# Patient Record
Sex: Male | Born: 1975 | Race: Black or African American | Hispanic: No | Marital: Married | Smoking: Current every day smoker
Health system: Southern US, Community
[De-identification: ages and names within clinical notes are randomized; demographics above are authoritative.]

## PROBLEM LIST (undated history)

## (undated) DIAGNOSIS — A048 Other specified bacterial intestinal infections: Secondary | ICD-10-CM

## (undated) DIAGNOSIS — K7460 Unspecified cirrhosis of liver: Secondary | ICD-10-CM

## (undated) HISTORY — PX: APPENDECTOMY: SHX54

## (undated) HISTORY — PX: COLONOSCOPY W/ BIOPSIES AND POLYPECTOMY: SHX1376

---

## 2008-11-29 ENCOUNTER — Emergency Department (HOSPITAL_BASED_OUTPATIENT_CLINIC_OR_DEPARTMENT_OTHER): Admission: EM | Admit: 2008-11-29 | Discharge: 2008-11-29 | Payer: Self-pay | Admitting: Emergency Medicine

## 2008-11-29 ENCOUNTER — Ambulatory Visit: Payer: Self-pay | Admitting: Diagnostic Radiology

## 2012-11-22 ENCOUNTER — Encounter (HOSPITAL_BASED_OUTPATIENT_CLINIC_OR_DEPARTMENT_OTHER): Payer: Self-pay | Admitting: *Deleted

## 2012-11-22 ENCOUNTER — Emergency Department (HOSPITAL_BASED_OUTPATIENT_CLINIC_OR_DEPARTMENT_OTHER)
Admission: EM | Admit: 2012-11-22 | Discharge: 2012-11-22 | Disposition: A | Discharge status: Home / Self Care | Payer: Self-pay | Attending: Emergency Medicine | Admitting: Emergency Medicine

## 2012-11-22 DIAGNOSIS — F17200 Nicotine dependence, unspecified, uncomplicated: Secondary | ICD-10-CM | POA: Insufficient documentation

## 2012-11-22 DIAGNOSIS — H109 Unspecified conjunctivitis: Secondary | ICD-10-CM | POA: Insufficient documentation

## 2012-11-22 MED ORDER — ERYTHROMYCIN 5 MG/GM OP OINT: TOPICAL_OINTMENT | OPHTHALMIC | Status: DC

## 2012-11-22 MED ORDER — FLUORESCEIN SODIUM 1 MG OP STRP
ORAL_STRIP | OPHTHALMIC | Status: AC
  Administered 2012-11-22: 13:00:00
  Filled 2012-11-22: qty 1

## 2012-11-22 MED ORDER — TETRACAINE HCL 0.5 % OP SOLN
1.0000 [drp] | Freq: Once | OPHTHALMIC | Status: AC
  Administered 2012-11-22: 1 [drp] via OPHTHALMIC
  Filled 2012-11-22: qty 2

## 2012-11-22 NOTE — ED Provider Notes (Signed)
History     CSN: [id number]  Arrival date & time 11/22/12  1204   First MD Initiated Contact with Patient 11/22/12 1213      Chief Complaint  Patient presents with  . Eye Problem    (Consider location/radiation/quality/duration/timing/severity/associated sxs/prior treatment) HPI Comments: Patient presents emergency department with chief complaint of right eye pain. Patient states that he first noticed irritation to his right eye yesterday at work. He denies getting anything in it. He denies any mechanism of injury. States that it itches, and that he has had a moderate amount of purulent discharge from the eye. He denies any fever, visual deficits, or double vision. He has not tried anything to alleviate his symptoms. Nothing makes his symptoms better or worse.  The history is provided by the patient. No language interpreter was used.    History reviewed. No pertinent past medical history.  History reviewed. No pertinent past surgical history.  History reviewed. No pertinent family history.  History  Substance Use Topics  . Smoking status: Current Every Day Smoker  . Smokeless tobacco: Not on file  . Alcohol Use: Yes      Review of Systems  All other systems reviewed and are negative.    Allergies  Review of patient's allergies indicates no known allergies.  Home Medications   Current Outpatient Rx  Name  Route  Sig  Dispense  Refill  . erythromycin ophthalmic ointment      Place a 1/2 inch ribbon of ointment into the lower eyelid.   3.5 g   0     BP 128/90  Pulse 90  Temp(Src) 98.3 F (36.8 C) (Oral)  Resp 18  Ht 5\' 9"  (1.753 m)  Wt 165 lb (74.844 kg)  BMI 24.36 kg/m2  SpO2 97%  Physical Exam  Nursing note and vitals reviewed. Constitutional: He is oriented to person, place, and time. He appears well-developed and well-nourished.  HENT:  Head: Normocephalic and atraumatic.  Eyes: EOM are normal. Pupils are equal, round, and reactive to light. Right  eye exhibits discharge. Left eye exhibits no discharge. No scleral icterus.  No foreign bodies seen with slit-lamp and wood's exam, no lacerations or abrasions to the cornea  Neck: Normal range of motion.  Cardiovascular: Normal rate.   Pulmonary/Chest: Effort normal.  Abdominal: He exhibits no distension.  Musculoskeletal: Normal range of motion.  Neurological: He is alert and oriented to person, place, and time.  Skin: Skin is dry.  Psychiatric: He has a normal mood and affect. His behavior is normal. Judgment and thought content normal.    ED Course  Procedures (including critical care time)  Labs Reviewed - No data to display No results found.   1. Conjunctivitis       MDM  Patient with conjunctivitis. Visual acuity is intact. Will treat with Romycin. Followup with ophthalmology for worsening symptoms. Patient is stable and ready for discharge.        Roxy Horseman, PA-C 11/22/12 1254

## 2012-11-22 NOTE — ED Provider Notes (Signed)
History/physical exam/procedure(s) were performed by non-physician practitioner and as supervising physician I was immediately available for consultation/collaboration. I have reviewed all notes and am in agreement with care and plan.   Hilario Quarry, MD 11/22/12 [phone]

## 2012-11-22 NOTE — ED Notes (Addendum)
Patient with right eye irritation and drainage.  States that son had pink eye about two weeks ago.  No problems with vision.

## 2012-11-24 ENCOUNTER — Emergency Department (HOSPITAL_BASED_OUTPATIENT_CLINIC_OR_DEPARTMENT_OTHER)
Admission: EM | Admit: 2012-11-24 | Discharge: 2012-11-24 | Disposition: A | Discharge status: Home / Self Care | Payer: Self-pay | Attending: Emergency Medicine | Admitting: Emergency Medicine

## 2012-11-24 ENCOUNTER — Encounter (HOSPITAL_BASED_OUTPATIENT_CLINIC_OR_DEPARTMENT_OTHER): Payer: Self-pay | Admitting: *Deleted

## 2012-11-24 ENCOUNTER — Emergency Department (HOSPITAL_BASED_OUTPATIENT_CLINIC_OR_DEPARTMENT_OTHER): Payer: Self-pay

## 2012-11-24 DIAGNOSIS — Y9389 Activity, other specified: Secondary | ICD-10-CM | POA: Insufficient documentation

## 2012-11-24 DIAGNOSIS — F17200 Nicotine dependence, unspecified, uncomplicated: Secondary | ICD-10-CM | POA: Insufficient documentation

## 2012-11-24 DIAGNOSIS — Y92410 Unspecified street and highway as the place of occurrence of the external cause: Secondary | ICD-10-CM | POA: Insufficient documentation

## 2012-11-24 DIAGNOSIS — S20219A Contusion of unspecified front wall of thorax, initial encounter: Secondary | ICD-10-CM | POA: Insufficient documentation

## 2012-11-24 DIAGNOSIS — S161XXA Strain of muscle, fascia and tendon at neck level, initial encounter: Secondary | ICD-10-CM

## 2012-11-24 DIAGNOSIS — S20212A Contusion of left front wall of thorax, initial encounter: Secondary | ICD-10-CM

## 2012-11-24 DIAGNOSIS — S139XXA Sprain of joints and ligaments of unspecified parts of neck, initial encounter: Secondary | ICD-10-CM | POA: Insufficient documentation

## 2012-11-24 MED ORDER — IBUPROFEN 800 MG PO TABS: 800.0000 mg | ORAL_TABLET | Freq: Three times a day (TID) | ORAL | Status: DC

## 2012-11-24 MED ORDER — METHOCARBAMOL 500 MG PO TABS: 500.0000 mg | ORAL_TABLET | Freq: Two times a day (BID) | ORAL | Status: DC
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
## 2012-11-24 NOTE — ED Provider Notes (Signed)
Medical screening examination/treatment/procedure(s) were performed by non-physician practitioner and as supervising physician I was immediately available for consultation/collaboration.   Dione Booze, MD 11/24/12 [phone]
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
## 2017-07-03 ENCOUNTER — Encounter (HOSPITAL_BASED_OUTPATIENT_CLINIC_OR_DEPARTMENT_OTHER): Payer: Self-pay | Admitting: Emergency Medicine

## 2017-07-03 ENCOUNTER — Emergency Department (HOSPITAL_BASED_OUTPATIENT_CLINIC_OR_DEPARTMENT_OTHER)
Admission: EM | Admit: 2017-07-03 | Discharge: 2017-07-03 | Disposition: A | Discharge status: Home / Self Care | Payer: Self-pay | Attending: Emergency Medicine | Admitting: Emergency Medicine

## 2017-07-03 ENCOUNTER — Other Ambulatory Visit: Payer: Self-pay

## 2017-07-03 DIAGNOSIS — F17210 Nicotine dependence, cigarettes, uncomplicated: Secondary | ICD-10-CM | POA: Insufficient documentation

## 2017-07-03 DIAGNOSIS — A084 Viral intestinal infection, unspecified: Secondary | ICD-10-CM

## 2017-07-03 HISTORY — DX: Other specified bacterial intestinal infections: A04.8

## 2017-07-03 LAB — CBG MONITORING, ED: GLUCOSE-CAPILLARY: 109 mg/dL — AB (ref 65–99)

## 2017-07-03 MED ORDER — SODIUM CHLORIDE 0.9 % IV BOLUS (SEPSIS)
1000.0000 mL | Freq: Once | INTRAVENOUS | Status: AC
Start: 2017-07-03 — End: 2017-07-03
  Administered 2017-07-03: 1000 mL via INTRAVENOUS

## 2017-07-03 MED ORDER — ONDANSETRON HCL 4 MG/2ML IJ SOLN
4.0000 mg | Freq: Once | INTRAMUSCULAR | Status: AC
  Administered 2017-07-03: 4 mg via INTRAVENOUS
  Filled 2017-07-03: qty 2

## 2017-07-03 MED ORDER — SODIUM CHLORIDE 0.9 % IV BOLUS (SEPSIS)
1000.0000 mL | Freq: Once | INTRAVENOUS | Status: AC
  Administered 2017-07-03: 1000 mL via INTRAVENOUS

## 2017-07-03 MED ORDER — PROMETHAZINE HCL 25 MG/ML IJ SOLN
12.5000 mg | Freq: Once | INTRAMUSCULAR | Status: AC
  Administered 2017-07-03: 12.5 mg via INTRAVENOUS
  Filled 2017-07-03: qty 1

## 2017-07-03 MED ORDER — ONDANSETRON 8 MG PO TBDP: 8.0000 mg | ORAL_TABLET | Freq: Three times a day (TID) | ORAL | 0 refills | Status: DC | PRN

## 2017-07-03 NOTE — ED Provider Notes (Signed)
[facility] Provider Note: Seth DellJ. Lane Selenia Mihok, MD, FACEP  CSN: [id number] MRN: [id number] ARRIVAL: 07/03/17 at 0232 ROOM: [facility]   CHIEF COMPLAINT  Dizziness   HISTORY OF PRESENT ILLNESS  07/03/17 2:44 AM Seth Holmes is a 41 y.o. male with about a 3-hour history of nausea, vomiting and diarrhea.  He has had about 3 episodes each of vomiting and diarrhea.  He feels weak and lightheaded when standing.  He is not aware of having a fever.  He denies abdominal pain or cramping.  He is having blurred vision.   Past Medical History:  Diagnosis Date  . H. pylori infection     Past Surgical History:  Procedure Laterality Date  . APPENDECTOMY    . COLONOSCOPY W/ BIOPSIES AND POLYPECTOMY      No family history on file.  Social History   Tobacco Use  . Smoking status: Current Every Day Smoker    Packs/day: 0.50    Types: Cigarettes  . Smokeless tobacco: Never Used  Substance Use Topics  . Alcohol use: Yes  . Drug use: No    Prior to Admission medications   Medication Sig Start Date End Date Taking? Authorizing Provider  folic acid (FOLVITE) 1 MG tablet Take 1 mg by mouth daily.   Yes [provider]  Multiple Vitamin (MULTIVITAMIN) tablet Take 1 tablet by mouth daily.   Yes [provider]  pantoprazole (PROTONIX) 40 MG tablet Take 40 mg by mouth daily.   Yes [provider]  thiamine 100 MG tablet Take 100 mg by mouth daily.   Yes [provider]  ondansetron (ZOFRAN ODT) 8 MG disintegrating tablet Take 1 tablet (8 mg total) by mouth every 8 (eight) hours as needed for nausea or vomiting. 07/03/17   Samyak Sackmann, Jonny RuizJohn, MD    Allergies Patient has no known allergies.   REVIEW OF SYSTEMS  Negative except as noted here or in the History of Present Illness.   PHYSICAL EXAMINATION  Initial Vital Signs Blood pressure (!) 132/99, pulse 63, temperature 98.2 F (36.8 C), temperature source Oral, resp. rate 18, height 5\' 9"  (1.753  m), weight 77.1 kg (170 lb), SpO2 99 %.  Examination General: Well-developed, well-nourished male in no acute distress; appearance consistent with age of record HENT: normocephalic; atraumatic Eyes: pupils equal, round and reactive to light; extraocular muscles intact Neck: supple Heart: regular rate and rhythm Lungs: clear to auscultation bilaterally Abdomen: soft; nondistended; nontender; no masses or hepatosplenomegaly; bowel sounds present Extremities: No deformity; full range of motion; pulses normal Neurologic: Awake, alert and oriented; motor function intact in all extremities and symmetric; no facial droop Skin: Warm and dry Psychiatric: Flat affect   RESULTS  Summary of this visit's results, reviewed by myself:   EKG Interpretation  Date/Time:    Ventricular Rate:    PR Interval:    QRS Duration:   QT Interval:    QTC Calculation:   R Axis:     Text Interpretation:        Laboratory Studies: Results for orders placed or performed during the hospital encounter of 07/03/17 (from the past 24 hour(s))  CBG monitoring, ED     Status: Abnormal   Collection Time: 07/03/17  2:49 AM  Result Value Ref Range   Glucose-Capillary 109 (H) 65 - 99 mg/dL   Imaging Studies: No results found.  ED COURSE  Nursing notes and initial vitals signs, including pulse oximetry, reviewed.  Vitals:   07/03/17 0238  BP: Marland Kitchen(!)  132/99  Pulse: 63  Resp: 18  Temp: 98.2 F (36.8 C)  TempSrc: Oral  SpO2: 99%  Weight: 77.1 kg (170 lb)  Height: 5\' 9"  (1.753 m)   5:23 AM Patient feeling better after IV fluid bolus and IV antiemetics.  Drinking fluids without emesis.  PROCEDURES    ED DIAGNOSES     ICD-10-CM   1. Viral gastroenteritis A08.4        Lizmarie Witters, MD 07/03/17 [phone]

## 2017-07-03 NOTE — ED Notes (Signed)
Pt has not vomited since drinking Gatorade. Pt still c/o dizziness. Pt ambulatory to bathroom to void for second time. Pt given crackers per request.

## 2017-07-03 NOTE — ED Triage Notes (Signed)
Pt c/o dizziness that started a few hours ago and has since had 2 episodes of vomiting and 3 episodes of diarrhea.

## 2017-09-30 ENCOUNTER — Emergency Department (HOSPITAL_BASED_OUTPATIENT_CLINIC_OR_DEPARTMENT_OTHER)
Admission: EM | Admit: 2017-09-30 | Discharge: 2017-09-30 | Disposition: A | Discharge status: Home / Self Care | Payer: Self-pay | Attending: Emergency Medicine | Admitting: Emergency Medicine

## 2017-09-30 ENCOUNTER — Encounter (HOSPITAL_BASED_OUTPATIENT_CLINIC_OR_DEPARTMENT_OTHER): Payer: Self-pay | Admitting: Emergency Medicine

## 2017-09-30 ENCOUNTER — Other Ambulatory Visit: Payer: Self-pay

## 2017-09-30 ENCOUNTER — Emergency Department (HOSPITAL_BASED_OUTPATIENT_CLINIC_OR_DEPARTMENT_OTHER): Payer: Self-pay

## 2017-09-30 DIAGNOSIS — M79642 Pain in left hand: Secondary | ICD-10-CM | POA: Insufficient documentation

## 2017-09-30 DIAGNOSIS — M7989 Other specified soft tissue disorders: Secondary | ICD-10-CM

## 2017-09-30 DIAGNOSIS — R2232 Localized swelling, mass and lump, left upper limb: Secondary | ICD-10-CM | POA: Insufficient documentation

## 2017-09-30 DIAGNOSIS — F17210 Nicotine dependence, cigarettes, uncomplicated: Secondary | ICD-10-CM | POA: Insufficient documentation

## 2017-09-30 MED ORDER — IBUPROFEN 800 MG PO TABS
800.0000 mg | ORAL_TABLET | Freq: Once | ORAL | Status: AC
  Administered 2017-09-30: 800 mg via ORAL
  Filled 2017-09-30: qty 1

## 2017-09-30 MED ORDER — IBUPROFEN 800 MG PO TABS: 800.0000 mg | ORAL_TABLET | Freq: Three times a day (TID) | ORAL | 0 refills | Status: DC

## 2017-09-30 NOTE — ED Provider Notes (Addendum)
[facility] EMERGENCY DEPARTMENT Provider Note   CSN: [id number] Arrival date & time: 09/30/17  0841     History   Chief Complaint Chief Complaint  Patient presents with  . Hand Injury    HPI Seth Holmes is a 42 y.o. male.  42 year old right-handed male who presents with left hand pain and swelling.  He reports 1 month of intermittent swelling and pain on his dorsal left hand.  He denies any specific injury to his hand but states that it seems to get worse when he is using his hand lifting at work.  He notices that the swelling goes down when he sleeps but this morning the swelling had continued.  Pain is worse with movement.  No medications prior to arrival.  No IV drug use.   The history is provided by the patient.  Hand Injury   Pertinent negatives include no fever.    Past Medical History:  Diagnosis Date  . H. pylori infection     There are no active problems to display for this patient.   Past Surgical History:  Procedure Laterality Date  . APPENDECTOMY    . COLONOSCOPY W/ BIOPSIES AND POLYPECTOMY         Home Medications    Prior to Admission medications   Not on File    Family History No family history on file.  Social History Social History   Tobacco Use  . Smoking status: Current Every Day Smoker    Packs/day: 0.50    Types: Cigarettes  . Smokeless tobacco: Never Used  Substance Use Topics  . Alcohol use: Yes  . Drug use: No     Allergies   Patient has no known allergies.   Review of Systems Review of Systems  Constitutional: Negative for fever.  Musculoskeletal: Positive for joint swelling.  Skin: Negative for rash and wound.  Neurological: Negative for weakness and numbness.     Physical Exam Updated Vital Signs BP (!) 141/86   Pulse 87   Temp 98.1 F (36.7 C) (Oral)   Resp 18   Ht 5\' 9"  (1.753 m)   Wt 79.4 kg (175 lb)   SpO2 99%   BMI 25.84 kg/m   Physical Exam  Constitutional: He is oriented to  person, place, and time. He appears well-developed and well-nourished. No distress.  HENT:  Head: Normocephalic and atraumatic.  Eyes: Conjunctivae are normal.  Neck: Neck supple.  Musculoskeletal: He exhibits edema and tenderness.  Mild edema dorsal L hand predominantly over 3rd MCP joint with tenderness; full ROM and strength of fingers and thumb, no wrist swelling or pain, no palmar involvement  Neurological: He is alert and oriented to person, place, and time. No sensory deficit.  Skin: Skin is warm and dry. No rash noted. No erythema.  Psychiatric: He has a normal mood and affect. Judgment normal.  Nursing note and vitals reviewed.    ED Treatments / Results  Labs (all labs ordered are listed, but only abnormal results are displayed) Labs Reviewed - No data to display  EKG  EKG Interpretation None       Radiology Dg Hand Complete Left  Result Date: 09/30/2017 CLINICAL DATA:  Swelling of the hand with some pain over the last month, no known injury EXAM: LEFT HAND - COMPLETE 3+ VIEW COMPARISON:  None. FINDINGS: The left radiocarpal joint space appears normal and the carpal bones are in normal position. The ulnar styloid is intact. MCP, PIP, DIP joints appear normal. No  erosion is seen. No fracture is noted. IMPRESSION: Negative. Electronically Signed   By: Dwyane Dee M.D.   On: 09/30/2017 09:25    Procedures .Splint Application Date/Time: 09/30/2017 9:51 AM Performed by: Laurence Spates, MD Authorized by: Laurence Spates, MD   Consent:    Consent obtained:  Verbal   Consent given by:  Patient Pre-procedure details:    Sensation:  Normal Procedure details:    Laterality:  Left   Location:  Hand   Splint type:  Wrist   Supplies:  Prefabricated splint Post-procedure details:    Pain:  Unchanged   Sensation:  Normal   Patient tolerance of procedure:  Tolerated well, no immediate complications   (including critical care time)  Medications Ordered in  ED Medications - No data to display   Initial Impression / Assessment and Plan / ED Course  I have reviewed the triage vital signs and the nursing notes.  Pertinent  imaging results that were available during my care of the patient were reviewed by me and considered in my medical decision making (see chart for details).    XR negative. No evidence of infection on exam and given 1 mo h/o waxing and waning sx infection seems very unlikely. Recommended NSAIDs, elevation and immobilization with brace. Provided sports med f/u info as needed if persistent sx. Return precautions reviewed.  Final Clinical Impressions(s) / ED Diagnoses   Final diagnoses:  None    ED Discharge Orders    None       Little, Ambrose Finland, MD 09/30/17 6962    Clarene Duke Ambrose Finland, MD 09/30/17 [phone]

## 2017-09-30 NOTE — ED Triage Notes (Addendum)
Pt states his left hand has been hurting and swelling for over a month.   Usually the swelling goes down after sleeping all night but this am the swelling continues.  Noted obvious swelling to left hand, painful with movement, radial pulse 2+.  No redness or cyanosis noted.  Pt states he doesn't recall any injuries but does heavy lifting at work.

## 2017-11-23 ENCOUNTER — Emergency Department (HOSPITAL_BASED_OUTPATIENT_CLINIC_OR_DEPARTMENT_OTHER)
Admission: EM | Admit: 2017-11-23 | Discharge: 2017-11-24 | Disposition: A | Discharge status: Home / Self Care | Payer: Managed Care, Other (non HMO) | Attending: Emergency Medicine | Admitting: Emergency Medicine

## 2017-11-23 ENCOUNTER — Encounter (HOSPITAL_BASED_OUTPATIENT_CLINIC_OR_DEPARTMENT_OTHER): Payer: Self-pay | Admitting: Emergency Medicine

## 2017-11-23 ENCOUNTER — Emergency Department (HOSPITAL_BASED_OUTPATIENT_CLINIC_OR_DEPARTMENT_OTHER): Payer: Managed Care, Other (non HMO)

## 2017-11-23 ENCOUNTER — Other Ambulatory Visit: Payer: Self-pay

## 2017-11-23 DIAGNOSIS — M79642 Pain in left hand: Secondary | ICD-10-CM | POA: Diagnosis present

## 2017-11-23 DIAGNOSIS — F17210 Nicotine dependence, cigarettes, uncomplicated: Secondary | ICD-10-CM | POA: Insufficient documentation

## 2017-11-23 MED ORDER — HYDROCODONE-ACETAMINOPHEN 5-325 MG PO TABS: 1.0000 | ORAL_TABLET | Freq: Four times a day (QID) | ORAL | 0 refills | Status: DC | PRN

## 2017-11-23 MED ORDER — KETOROLAC TROMETHAMINE 60 MG/2ML IM SOLN
60.0000 mg | Freq: Once | INTRAMUSCULAR | Status: AC
Start: 2017-11-23 — End: 2017-11-23
  Administered 2017-11-23: 60 mg via INTRAMUSCULAR
  Filled 2017-11-23: qty 2

## 2017-11-23 MED ORDER — HYDROCODONE-ACETAMINOPHEN 5-325 MG PO TABS: 1.0000 | ORAL_TABLET | Freq: Once | ORAL | Status: DC

## 2017-11-23 MED ORDER — IBUPROFEN 800 MG PO TABS: 800.0000 mg | ORAL_TABLET | Freq: Three times a day (TID) | ORAL | 0 refills | Status: DC

## 2017-11-23 NOTE — ED Notes (Signed)
Pt refuses XR at this time.

## 2017-11-23 NOTE — ED Triage Notes (Signed)
L hand pain and swelling for several days. Unknown if he injured it. States it hurts worse during work.

## 2017-11-23 NOTE — ED Notes (Signed)
Pt given d/c instructions as per chart. Verbalizes understanding. No questions. Rx x 2 with precautions. 

## 2017-11-24 NOTE — ED Provider Notes (Signed)
[facility] EMERGENCY DEPARTMENT Provider Note   CSN: [id number] Arrival date & time: 11/23/17  1828     History   Chief Complaint Chief Complaint  Patient presents with  . Hand Pain    HPI Seth Holmes is a 42 y.o. male.   Hand Pain  This is a recurrent problem. The current episode started more than 1 week ago. The problem occurs constantly. The problem has not changed since onset.Pertinent negatives include no chest pain, no abdominal pain and no headaches. The symptoms are aggravated by bending and twisting. Nothing relieves the symptoms. He has tried nothing for the symptoms.    Past Medical History:  Diagnosis Date  . H. pylori infection     There are no active problems to display for this patient.   Past Surgical History:  Procedure Laterality Date  . APPENDECTOMY    . COLONOSCOPY W/ BIOPSIES AND POLYPECTOMY          Home Medications    Prior to Admission medications   Medication Sig Start Date End Date Taking? Authorizing Provider  HYDROcodone-acetaminophen (NORCO/VICODIN) 5-325 MG tablet Take 1 tablet by mouth every 6 (six) hours as needed for severe pain. 11/23/17   Jacy Howat, Barbara Cower, MD  ibuprofen (ADVIL,MOTRIN) 800 MG tablet Take 1 tablet (800 mg total) by mouth 3 (three) times daily. 11/23/17   Katheen Aslin, Barbara Cower, MD    Family History No family history on file.  Social History Social History   Tobacco Use  . Smoking status: Current Every Day Smoker    Packs/day: 0.50    Types: Cigarettes  . Smokeless tobacco: Never Used  Substance Use Topics  . Alcohol use: Yes  . Drug use: No     Allergies   Patient has no known allergies.   Review of Systems Review of Systems  Cardiovascular: Negative for chest pain.  Gastrointestinal: Negative for abdominal pain.  Neurological: Negative for headaches.  All other systems reviewed and are negative.    Physical Exam Updated Vital Signs BP (!) 145/93 (BP Location: Right Arm)   Pulse 78   Temp  98.2 F (36.8 C) (Oral)   Resp 18   Ht  (1.753 m)   Wt 79.4 kg (175 lb)   SpO2 98%   BMI 25.84 kg/m   Physical Exam  Constitutional: He appears well-developed and well-nourished.  HENT:  Head: Normocephalic and atraumatic.  Eyes: Conjunctivae and EOM are normal.  Neck: Normal range of motion.  Cardiovascular: Normal rate.  Pulmonary/Chest: Effort normal. No respiratory distress.  Abdominal: Soft. Bowel sounds are normal. He exhibits no distension.  Musculoskeletal: Normal range of motion. He exhibits edema and tenderness (left dorsal hand. no erythema, warmth, induration or fluctuance).  Neurological: He is alert.  Skin: Skin is warm and dry.  Nursing note and vitals reviewed.    ED Treatments / Results  Labs (all labs ordered are listed, but only abnormal results are displayed) Labs Reviewed - No data to display  EKG None  Radiology Dg Hand Complete Left  Result Date: 11/23/2017 CLINICAL DATA:  Acute onset left hand pain for 1 week. EXAM: LEFT HAND - COMPLETE 3+ VIEW COMPARISON:  09/30/2017 FINDINGS: There is no evidence of fracture or dislocation. Normal bone mineralization. No marginal nor extra articular erosions. No evidence of periostitis. Stable small ossicle adjacent to the base of the fifth proximal phalanx. There is no evidence of arthropathy or other focal bone abnormality. Carpal rows are maintained. The distal radius and ulna are  unremarkable. Soft tissues are unremarkable. IMPRESSION: No radiographic findings for the patient's hand pain. Electronically Signed   By: Tollie Eth M.D.   On: 11/23/2017 21:59    Procedures Procedures (including critical care time)  Medications Ordered in ED Medications  ketorolac (TORADOL) injection 60 mg (60 mg Intramuscular Given 11/23/17 2155)     Initial Impression / Assessment and Plan / ED Course  I have reviewed the triage vital signs and the nursing notes.  Pertinent labs & imaging results that were available  during my care of the patient were reviewed by me and considered in my medical decision making (see chart for details).     Unclear etiology. Suggest ace wrap/nsaids and ortho follow up. Low suspicion for infection/cyst/abscess or fracture at this time.   Final Clinical Impressions(s) / ED Diagnoses   Final diagnoses:  Left hand pain    ED Discharge Orders        Ordered    HYDROcodone-acetaminophen (NORCO/VICODIN) 5-325 MG tablet  Every 6 hours PRN     11/23/17 2331    ibuprofen (ADVIL,MOTRIN) 800 MG tablet  3 times daily     11/23/17 2331       Giovanni Bath, Barbara Cower, MD 11/24/17 1640

## 2019-09-04 ENCOUNTER — Encounter (HOSPITAL_BASED_OUTPATIENT_CLINIC_OR_DEPARTMENT_OTHER): Payer: Self-pay | Admitting: Emergency Medicine

## 2019-09-04 ENCOUNTER — Emergency Department (HOSPITAL_BASED_OUTPATIENT_CLINIC_OR_DEPARTMENT_OTHER): Payer: 59

## 2019-09-04 ENCOUNTER — Other Ambulatory Visit: Payer: Self-pay

## 2019-09-04 ENCOUNTER — Emergency Department (HOSPITAL_BASED_OUTPATIENT_CLINIC_OR_DEPARTMENT_OTHER)
Admission: EM | Admit: 2019-09-04 | Discharge: 2019-09-04 | Disposition: A | Discharge status: Home / Self Care | Payer: 59 | Attending: Emergency Medicine | Admitting: Emergency Medicine

## 2019-09-04 DIAGNOSIS — R945 Abnormal results of liver function studies: Secondary | ICD-10-CM | POA: Diagnosis not present

## 2019-09-04 DIAGNOSIS — M79604 Pain in right leg: Secondary | ICD-10-CM | POA: Diagnosis present

## 2019-09-04 DIAGNOSIS — R7989 Other specified abnormal findings of blood chemistry: Secondary | ICD-10-CM

## 2019-09-04 DIAGNOSIS — R609 Edema, unspecified: Secondary | ICD-10-CM | POA: Insufficient documentation

## 2019-09-04 DIAGNOSIS — Z79899 Other long term (current) drug therapy: Secondary | ICD-10-CM | POA: Insufficient documentation

## 2019-09-04 DIAGNOSIS — D539 Nutritional anemia, unspecified: Secondary | ICD-10-CM | POA: Insufficient documentation

## 2019-09-04 DIAGNOSIS — F17210 Nicotine dependence, cigarettes, uncomplicated: Secondary | ICD-10-CM | POA: Diagnosis not present

## 2019-09-04 LAB — CBC WITH DIFFERENTIAL/PLATELET
Abs Immature Granulocytes: 0.02 10*3/uL (ref 0.00–0.07)
Basophils Absolute: 0.1 10*3/uL (ref 0.0–0.1)
Basophils Relative: 1 %
Eosinophils Absolute: 0.2 10*3/uL (ref 0.0–0.5)
Eosinophils Relative: 3 %
HCT: 27.2 % — ABNORMAL LOW (ref 39.0–52.0)
Hemoglobin: 8.8 g/dL — ABNORMAL LOW (ref 13.0–17.0)
Immature Granulocytes: 0 %
Lymphocytes Relative: 34 %
Lymphs Abs: 2.7 10*3/uL (ref 0.7–4.0)
MCH: 35.1 pg — ABNORMAL HIGH (ref 26.0–34.0)
MCHC: 32.4 g/dL (ref 30.0–36.0)
MCV: 108.4 fL — ABNORMAL HIGH (ref 80.0–100.0)
Monocytes Absolute: 0.7 10*3/uL (ref 0.1–1.0)
Monocytes Relative: 9 %
Neutro Abs: 4.2 10*3/uL (ref 1.7–7.7)
Neutrophils Relative %: 53 %
Platelets: 212 10*3/uL (ref 150–400)
RBC: 2.51 MIL/uL — ABNORMAL LOW (ref 4.22–5.81)
RDW: 14.4 % (ref 11.5–15.5)
WBC: 7.9 10*3/uL (ref 4.0–10.5)
nRBC: 0 % (ref 0.0–0.2)

## 2019-09-04 LAB — COMPREHENSIVE METABOLIC PANEL
ALT: 26 U/L (ref 0–44)
AST: 103 U/L — ABNORMAL HIGH (ref 15–41)
Albumin: 2.5 g/dL — ABNORMAL LOW (ref 3.5–5.0)
Alkaline Phosphatase: 133 U/L — ABNORMAL HIGH (ref 38–126)
Anion gap: 9 (ref 5–15)
BUN: 3 mg/dL — ABNORMAL LOW (ref 6–20)
CO2: 20 mmol/L — ABNORMAL LOW (ref 22–32)
Calcium: 8.7 mg/dL — ABNORMAL LOW (ref 8.9–10.3)
Chloride: 107 mmol/L (ref 98–111)
Creatinine, Ser: 0.61 mg/dL (ref 0.61–1.24)
GFR calc Af Amer: >60 mL/min (ref >60)
GFR calc non Af Amer: >60 mL/min (ref >60)
Glucose, Bld: 96 mg/dL (ref 70–99)
Potassium: 3.6 mmol/L (ref 3.5–5.1)
Sodium: 136 mmol/L (ref 135–145)
Total Bilirubin: 3.3 mg/dL — ABNORMAL HIGH (ref 0.3–1.2)
Total Protein: 9.3 g/dL — ABNORMAL HIGH (ref 6.5–8.1)

## 2019-09-04 MED ORDER — FOLIC ACID 400 MCG PO TABS: 400.0000 ug | ORAL_TABLET | Freq: Every day | ORAL | 0 refills | Status: DC

## 2019-09-04 MED ORDER — VITAMIN B-12 100 MCG PO TABS: 100.0000 ug | ORAL_TABLET | Freq: Every day | ORAL | 0 refills | Status: DC

## 2019-09-04 MED ORDER — FUROSEMIDE 20 MG PO TABS: 20.0000 mg | ORAL_TABLET | Freq: Two times a day (BID) | ORAL | 0 refills | Status: DC

## 2019-09-04 MED ORDER — POTASSIUM CHLORIDE CRYS ER 20 MEQ PO TBCR: 20.0000 meq | EXTENDED_RELEASE_TABLET | Freq: Every day | ORAL | 0 refills | Status: DC

## 2019-09-04 NOTE — ED Provider Notes (Addendum)
[facility] EMERGENCY DEPARTMENT Provider Note   CSN: [id number] Arrival date & time: 09/04/19  1128     History Chief Complaint  Patient presents with  . Foot Swelling    Seth Holmes is a 44 y.o. male.  HPI   44 year old male with lower extremity edema.  Onset several weeks ago.  Progressively worsening.  First noticed in right leg and has since progressed to his left leg as well.  Denies any pain.  Review went to urgent care for evaluation was placed on antibiotics for possible infection.  Perhaps some mild improvement initially before worsening again.  Denies any rash.  No fevers or chills.  No respiratory complaints.  No orthopnea.  No abdominal pain.  He drinks alcohol daily and has done so for years. He was hesitant to quantify how much. I threw out numbers (1, 6-pack, a case, etc) and he replied : "Yeah about a 6-pack."   Past Medical History:  Diagnosis Date  . H. pylori infection    There are no problems to display for this patient.  Past Surgical History:  Procedure Laterality Date  . APPENDECTOMY    . COLONOSCOPY W/ BIOPSIES AND POLYPECTOMY       History reviewed. No pertinent family history.  Social History   Tobacco Use  . Smoking status: Current Every Day Smoker    Packs/day: 0.50    Types: Cigarettes  . Smokeless tobacco: Never Used  Substance Use Topics  . Alcohol use: Yes  . Drug use: No   Home Medications Prior to Admission medications   Medication Sig Start Date End Date Taking? Authorizing Provider  folic acid (V-R FOLIC ACID) 400 MCG tablet Take 1 tablet (400 mcg total) by mouth daily. 09/04/19   Raeford Razor, MD  furosemide (LASIX) 20 MG tablet Take 1 tablet (20 mg total) by mouth 2 (two) times daily. 09/04/19   Raeford Razor, MD  HYDROcodone-acetaminophen (NORCO/VICODIN) 5-325 MG tablet Take 1 tablet by mouth every 6 (six) hours as needed for severe pain. 11/23/17   Mesner, Barbara Cower, MD  ibuprofen (ADVIL,MOTRIN) 800 MG tablet Take 1  tablet (800 mg total) by mouth 3 (three) times daily. 11/23/17   Mesner, Barbara Cower, MD  potassium chloride SA (KLOR-CON) 20 MEQ tablet Take 1 tablet (20 mEq total) by mouth daily. 09/04/19   Raeford Razor, MD  vitamin B-12 (CYANOCOBALAMIN) 100 MCG tablet Take 1 tablet (100 mcg total) by mouth daily. 09/04/19   Raeford Razor, MD   Allergies    Patient has no known allergies.  Review of Systems   Review of Systems All systems reviewed and negative, other than as noted in HPI.  Physical Exam Updated Vital Signs BP 135/90 (BP Location: Right Arm)   Pulse 93   Temp 98.2 F (36.8 C) (Oral)   Resp 16   Ht 5\' 10"  (1.778 m)   Wt 79.4 kg   SpO2 100%   BMI 25.11 kg/m   Physical Exam Vitals and nursing note reviewed.  Constitutional:      General: He is not in acute distress.    Appearance: He is well-developed.  HENT:     Head: Normocephalic and atraumatic.  Eyes:     General:        Right eye: No discharge.        Left eye: No discharge.     Conjunctiva/sclera: Conjunctivae normal.  Cardiovascular:     Rate and Rhythm: Normal rate and regular rhythm.     Heart  sounds: Normal heart sounds. No murmur. No friction rub. No gallop.   Pulmonary:     Effort: Pulmonary effort is normal. No respiratory distress.     Breath sounds: Normal breath sounds.  Abdominal:     General: There is no distension.     Palpations: Abdomen is soft.     Tenderness: There is no abdominal tenderness.  Musculoskeletal:        General: No tenderness.     Cervical back: Neck supple.     Comments: Somewhat pitting edema to bilateral lower extremities, right greater than left.  No calf tenderness.  No concerning skin changes otherwise.  Good DP pulses bilaterally.  Skin:    General: Skin is warm and dry.  Neurological:     Mental Status: He is alert.  Psychiatric:        Behavior: Behavior normal.        Thought Content: Thought content normal.     ED Results / Procedures / Treatments   Labs (all labs  ordered are listed, but only abnormal results are displayed) Labs Reviewed  CBC WITH DIFFERENTIAL/PLATELET - Abnormal; Notable for the following components:      Result Value   RBC 2.51 (*)    Hemoglobin 8.8 (*)    HCT 27.2 (*)    MCV 108.4 (*)    MCH 35.1 (*)    All other components within normal limits  COMPREHENSIVE METABOLIC PANEL - Abnormal; Notable for the following components:   CO2 20 (*)    BUN 3 (*)    Calcium 8.7 (*)    Total Protein 9.3 (*)    Albumin 2.5 (*)    AST 103 (*)    Alkaline Phosphatase 133 (*)    Total Bilirubin 3.3 (*)    All other components within normal limits    EKG None  Radiology US Venous Img Lower Bilateral  Result Date: 09/04/2019 CLINICAL DATA:  Bilateral lower extremity swelling for 1 month EXAM: BILATERAL LOWER EXTREMITY VENOUS DOPPLER ULTRASOUND TECHNIQUE: Gray-scale sonography with graded compression, as well as color Doppler and duplex ultrasound were performed to evaluate the lower extremity deep venous systems from the level of the common femoral vein and including the common femoral, femoral, profunda femoral, popliteal and calf veins including the posterior tibial, peroneal and gastrocnemius veins when visible. The superficial great saphenous vein was also interrogated. Spectral Doppler was utilized to evaluate flow at rest and with distal augmentation maneuvers in the common femoral, femoral and popliteal veins. COMPARISON:  None. FINDINGS: RIGHT LOWER EXTREMITY Common Femoral Vein: No evidence of thrombus. Normal compressibility, respiratory phasicity and response to augmentation. Saphenofemoral Junction: No evidence of thrombus. Normal compressibility and flow on color Doppler imaging. Profunda Femoral Vein: No evidence of thrombus. Normal compressibility and flow on color Doppler imaging. Femoral Vein: No evidence of thrombus. Normal compressibility, respiratory phasicity and response to augmentation. Popliteal Vein: No evidence of thrombus.  Normal compressibility, respiratory phasicity and response to augmentation. Calf Veins: No evidence of thrombus. Normal compressibility and flow on color Doppler imaging. Superficial Great Saphenous Vein: No evidence of thrombus. Normal compressibility. Venous Reflux:  None. Other Findings:  None. LEFT LOWER EXTREMITY Common Femoral Vein: No evidence of thrombus. Normal compressibility, respiratory phasicity and response to augmentation. Saphenofemoral Junction: No evidence of thrombus. Normal compressibility and flow on color Doppler imaging. Profunda Femoral Vein: No evidence of thrombus. Normal compressibility and flow on color Doppler imaging. Femoral Vein: No evidence of thrombus. Normal compressibility, respiratory phasicity  and response to augmentation. Popliteal Vein: No evidence of thrombus. Normal compressibility, respiratory phasicity and response to augmentation. Calf Veins: No evidence of thrombus. Normal compressibility and flow on color Doppler imaging. Superficial Great Saphenous Vein: No evidence of thrombus. Normal compressibility. Venous Reflux:  None. Other Findings:  None. IMPRESSION: No evidence of deep venous thrombosis in either lower extremity. Electronically Signed   By: Alcide Clever M.D.   On: 09/04/2019 13:27    Procedures Procedures (including critical care time)  Medications Ordered in ED Medications - No data to display  ED Course  I have reviewed the triage vital signs and the nursing notes.  Pertinent labs & imaging results that were available during my care of the patient were reviewed by me and considered in my medical decision making (see chart for details).    MDM Rules/Calculators/A&P                      44 year old male with asymmetric edema in lower extremities.  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  Patient drinks quite a bit of alcohol daily.  He has been doing so for years.  Ultrasound was negative for DVT.  LFTs are abnormal and  hypoalbuminemia which is consistent with history of alcohol abuse.  He also has a macrocytic anemia.  Denies any overt bleeding, melena or symptoms to suggest that he is symptomatic from this anemia.  We will put him on B12 and folic acid for possible poor absorption with etoh use. Could also be from marrow suppression related to etoh. Platelets are normal. Consider GIB but doubt significant one. I explained to him under no certain terms that he really needs to stop alcohol.  He was provided with a list of resources.  He will be prescribed few days of Lasix/potassium.  I advised him that he needs to obtain a PCP that can follow him regularly.  Final Clinical Impression(s) / ED Diagnoses Final diagnoses:  Peripheral edema  Macrocytic anemia  Abnormal LFTs    Rx / DC Orders ED Discharge Orders         Ordered    furosemide (LASIX) 20 MG tablet  2 times daily     09/04/19 1338    potassium chloride SA (KLOR-CON) 20 MEQ tablet  Daily     09/04/19 1338    vitamin B-12 (CYANOCOBALAMIN) 100 MCG tablet  Daily     09/04/19 1338    folic acid (V-R FOLIC ACID) 400 MCG tablet  Daily     09/04/19 1338               Raeford Razor, MD 09/04/19 1406

## 2019-09-04 NOTE — ED Triage Notes (Signed)
Pt c/o bilateral foot and lower leg swelling. Pt reports treatment for R foot/leg on 1/24 at Urgent care and prescribed abx, with some relief. Pt now states left foot and leg swelling x 2 days. Pt denies pain or injury

## 2019-09-04 NOTE — ED Notes (Signed)
Pt reports a "feeling of tightness bilaterally in lower extremities

## 2019-09-04 NOTE — ED Notes (Signed)
Patient transported to Ultrasound 

## 2019-09-04 NOTE — Discharge Instructions (Signed)
I suspect you have developed liver cirrhosis and the swelling in your legs is related to this. You need to stop drinking alcohol. It is already giving you problems and it will only get worse if you continue to drink.    You do not have a blood clot in your legs. The prescribed medication called furosemide will make you urinate a lot and should help with the swelling. The potassium is to replace additional potassium you will be urinating out.   You are also anemic (low red blood cells). Many nutrients are not absorbed well in people that drink alcohol regularly. Deficiencies in B12 and folic acid can cause anemia. Alcohol in high quantities can also have a direct effect your bone marrow to the point that you make less.  We do no want you to experience any severe effects of cirrhosis. It's miserable. Please establish care with a primary care doctor that can see you regularly. Please seek help if you need assistance with alcohol cessation.

## 2019-10-22 ENCOUNTER — Emergency Department (HOSPITAL_BASED_OUTPATIENT_CLINIC_OR_DEPARTMENT_OTHER): Payer: 59

## 2019-10-22 ENCOUNTER — Other Ambulatory Visit: Payer: Self-pay

## 2019-10-22 ENCOUNTER — Encounter (HOSPITAL_BASED_OUTPATIENT_CLINIC_OR_DEPARTMENT_OTHER): Payer: Self-pay | Admitting: *Deleted

## 2019-10-22 ENCOUNTER — Emergency Department (HOSPITAL_BASED_OUTPATIENT_CLINIC_OR_DEPARTMENT_OTHER)
Admission: EM | Admit: 2019-10-22 | Discharge: 2019-10-22 | Disposition: A | Discharge status: Home / Self Care | Payer: 59 | Attending: Emergency Medicine | Admitting: Emergency Medicine

## 2019-10-22 DIAGNOSIS — R17 Unspecified jaundice: Secondary | ICD-10-CM

## 2019-10-22 DIAGNOSIS — R1011 Right upper quadrant pain: Secondary | ICD-10-CM

## 2019-10-22 DIAGNOSIS — K838 Other specified diseases of biliary tract: Secondary | ICD-10-CM | POA: Diagnosis not present

## 2019-10-22 DIAGNOSIS — R1032 Left lower quadrant pain: Secondary | ICD-10-CM | POA: Insufficient documentation

## 2019-10-22 DIAGNOSIS — F17210 Nicotine dependence, cigarettes, uncomplicated: Secondary | ICD-10-CM | POA: Insufficient documentation

## 2019-10-22 DIAGNOSIS — R109 Unspecified abdominal pain: Secondary | ICD-10-CM

## 2019-10-22 LAB — COMPREHENSIVE METABOLIC PANEL
ALT: 32 U/L (ref 0–44)
AST: 104 U/L — ABNORMAL HIGH (ref 15–41)
Albumin: 3.2 g/dL — ABNORMAL LOW (ref 3.5–5.0)
Alkaline Phosphatase: 128 U/L — ABNORMAL HIGH (ref 38–126)
Anion gap: 10 (ref 5–15)
BUN: 5 mg/dL — ABNORMAL LOW (ref 6–20)
CO2: 25 mmol/L (ref 22–32)
Calcium: 9.3 mg/dL (ref 8.9–10.3)
Chloride: 96 mmol/L — ABNORMAL LOW (ref 98–111)
Creatinine, Ser: 0.63 mg/dL (ref 0.61–1.24)
GFR calc Af Amer: >60 mL/min (ref >60)
GFR calc non Af Amer: >60 mL/min (ref >60)
Glucose, Bld: 106 mg/dL — ABNORMAL HIGH (ref 70–99)
Potassium: 3.4 mmol/L — ABNORMAL LOW (ref 3.5–5.1)
Sodium: 131 mmol/L — ABNORMAL LOW (ref 135–145)
Total Bilirubin: 6.4 mg/dL — ABNORMAL HIGH (ref 0.3–1.2)
Total Protein: 10.1 g/dL — ABNORMAL HIGH (ref 6.5–8.1)

## 2019-10-22 LAB — URINALYSIS, ROUTINE W REFLEX MICROSCOPIC
Glucose, UA: 100 mg/dL — AB
Hgb urine dipstick: NEGATIVE
Ketones, ur: NEGATIVE mg/dL
Leukocytes,Ua: NEGATIVE
Nitrite: NEGATIVE
Protein, ur: NEGATIVE mg/dL
Specific Gravity, Urine: 1.015 (ref 1.005–1.030)
pH: 7.5 (ref 5.0–8.0)

## 2019-10-22 LAB — CBC
HCT: 27.8 % — ABNORMAL LOW (ref 39.0–52.0)
Hemoglobin: 9.1 g/dL — ABNORMAL LOW (ref 13.0–17.0)
MCH: 36.1 pg — ABNORMAL HIGH (ref 26.0–34.0)
MCHC: 32.7 g/dL (ref 30.0–36.0)
MCV: 110.3 fL — ABNORMAL HIGH (ref 80.0–100.0)
Platelets: 210 10*3/uL (ref 150–400)
RBC: 2.52 MIL/uL — ABNORMAL LOW (ref 4.22–5.81)
RDW: 16.7 % — ABNORMAL HIGH (ref 11.5–15.5)
WBC: 7.9 10*3/uL (ref 4.0–10.5)
nRBC: 0 % (ref 0.0–0.2)

## 2019-10-22 LAB — HEPATIC FUNCTION PANEL
ALT: 30 U/L (ref 0–44)
AST: 105 U/L — ABNORMAL HIGH (ref 15–41)
Albumin: 3.2 g/dL — ABNORMAL LOW (ref 3.5–5.0)
Alkaline Phosphatase: 127 U/L — ABNORMAL HIGH (ref 38–126)
Bilirubin, Direct: 1.8 mg/dL — ABNORMAL HIGH (ref 0.0–0.2)
Indirect Bilirubin: 4.6 mg/dL — ABNORMAL HIGH (ref 0.3–0.9)
Total Bilirubin: 6.4 mg/dL — ABNORMAL HIGH (ref 0.3–1.2)
Total Protein: 10 g/dL — ABNORMAL HIGH (ref 6.5–8.1)

## 2019-10-22 LAB — LIPASE, BLOOD: Lipase: 45 U/L (ref 11–51)

## 2019-10-22 MED ORDER — DIAZEPAM 2 MG PO TABS
2.0000 mg | ORAL_TABLET | Freq: Once | ORAL | Status: AC
  Administered 2019-10-22: 2 mg via ORAL
  Filled 2019-10-22: qty 1

## 2019-10-22 MED ORDER — KETOROLAC TROMETHAMINE 60 MG/2ML IM SOLN
30.0000 mg | Freq: Once | INTRAMUSCULAR | Status: AC
  Administered 2019-10-22: 16:00:00 30 mg via INTRAMUSCULAR
  Filled 2019-10-22: qty 2

## 2019-10-22 NOTE — ED Provider Notes (Signed)
[facility] EMERGENCY DEPARTMENT Provider Note   CSN: [id number] Arrival date & time: 10/22/19  1415     History Chief Complaint  Patient presents with  . Pain at Medication Injection Site    Seth Holmes is a 44 y.o. male.  Presents to ER with chief complaint of pain over left lateral thigh.  Also had bruise over his thigh and lower leg.  Does not recall a specific injury.  Recently in Ortho office due to right hand injury.  Was not having any symptoms when went to Ortho.  Reports that he went to his regular doctor on Monday and was given an injection on his left side and has had some pain around that area.  Pain worse with certain movements, stretching.  Bearing weight without difficulty.  No dysuria, no hematuria.  Wife reports noted slight yellowing of his eyes over the past week or so.  Patient denies any abdominal pain, specifically no right upper quadrant abdominal pain, no associated nausea, fever, vomiting.  Denies prior history of liver issues or gallbladder issues.  HPI     Past Medical History:  Diagnosis Date  . H. pylori infection     There are no problems to display for this patient.   Past Surgical History:  Procedure Laterality Date  . APPENDECTOMY    . COLONOSCOPY W/ BIOPSIES AND POLYPECTOMY         No family history on file.  Social History   Tobacco Use  . Smoking status: Current Every Day Smoker    Packs/day: 0.50    Types: Cigarettes  . Smokeless tobacco: Never Used  Substance Use Topics  . Alcohol use: Yes  . Drug use: No    Home Medications Prior to Admission medications   Medication Sig Start Date End Date Taking? Authorizing Provider  folic acid (V-R FOLIC ACID) 268 MCG tablet Take 1 tablet (400 mcg total) by mouth daily. 09/04/19   Virgel Manifold, MD  furosemide (LASIX) 20 MG tablet Take 1 tablet (20 mg total) by mouth 2 (two) times daily. 09/04/19   Virgel Manifold, MD  HYDROcodone-acetaminophen (NORCO/VICODIN) 5-325 MG  tablet Take 1 tablet by mouth every 6 (six) hours as needed for severe pain. 11/23/17   Mesner, Corene Cornea, MD  ibuprofen (ADVIL,MOTRIN) 800 MG tablet Take 1 tablet (800 mg total) by mouth 3 (three) times daily. 11/23/17   Mesner, Corene Cornea, MD  potassium chloride SA (KLOR-CON) 20 MEQ tablet Take 1 tablet (20 mEq total) by mouth daily. 09/04/19   Virgel Manifold, MD  vitamin B-12 (CYANOCOBALAMIN) 100 MCG tablet Take 1 tablet (100 mcg total) by mouth daily. 09/04/19   Virgel Manifold, MD    Allergies    Patient has no known allergies.  Review of Systems   Review of Systems  Constitutional: Negative for chills and fever.  HENT: Negative for ear pain and sore throat.   Eyes: Negative for pain and visual disturbance.  Respiratory: Negative for cough and shortness of breath.   Cardiovascular: Negative for chest pain and palpitations.  Gastrointestinal: Negative for abdominal pain and vomiting.  Genitourinary: Negative for dysuria and hematuria.  Musculoskeletal: Positive for arthralgias. Negative for back pain.  Skin: Negative for color change and rash.  Neurological: Negative for seizures and syncope.  All other systems reviewed and are negative.   Physical Exam Updated Vital Signs BP 124/72 (BP Location: Left Arm)   Pulse 83   Temp 99.3 F (37.4 C) (Oral)   Resp 14   SpO2  100%   Physical Exam Vitals and nursing note reviewed.  Constitutional:      Appearance: He is well-developed.  HENT:     Head: Normocephalic and atraumatic.  Eyes:     Conjunctiva/sclera: Conjunctivae normal.  Cardiovascular:     Rate and Rhythm: Normal rate and regular rhythm.     Heart sounds: No murmur.  Pulmonary:     Effort: Pulmonary effort is normal. No respiratory distress.     Breath sounds: Normal breath sounds.  Abdominal:     Palpations: Abdomen is soft.     Tenderness: There is no abdominal tenderness.  Musculoskeletal:     Cervical back: Neck supple.     Comments: LLE: mild TTP over lateral upper  thigh, small scatter echymosis over posterior upper lower leg and upper leg, compartments soft, distal sensation, dp/pt pulses intact  Skin:    General: Skin is warm and dry.     Capillary Refill: Capillary refill takes less than 2 seconds.  Neurological:     General: No focal deficit present.     Mental Status: He is alert.     ED Results / Procedures / Treatments   Labs (all labs ordered are listed, but only abnormal results are displayed) Labs Reviewed  COMPREHENSIVE METABOLIC PANEL - Abnormal; Notable for the following components:      Result Value   Sodium 131 (*)    Potassium 3.4 (*)    Chloride 96 (*)    Glucose, Bld 106 (*)    BUN 5 (*)    Total Protein 10.1 (*)    Albumin 3.2 (*)    AST 104 (*)    Alkaline Phosphatase 128 (*)    Total Bilirubin 6.4 (*)    All other components within normal limits  CBC - Abnormal; Notable for the following components:   RBC 2.52 (*)    Hemoglobin 9.1 (*)    HCT 27.8 (*)    MCV 110.3 (*)    MCH 36.1 (*)    RDW 16.7 (*)    All other components within normal limits  URINALYSIS, ROUTINE W REFLEX MICROSCOPIC - Abnormal; Notable for the following components:   Color, Urine ORANGE (*)    Glucose, UA 100 (*)    Bilirubin Urine MODERATE (*)    All other components within normal limits  LIPASE, BLOOD    EKG None  Radiology DG Tibia/Fibula Left  Result Date: 10/22/2019 CLINICAL DATA:  Recent injection, pain EXAM: LEFT TIBIA AND FIBULA - 2 VIEW COMPARISON:  None. FINDINGS: There is no evidence of fracture or other focal bone lesions. Soft tissues are unremarkable. IMPRESSION: No fracture or dislocation of the left tibia or fibula. Electronically Signed   By: Lauralyn Primes M.D.   On: 10/22/2019 16:22   DG Femur Min 2 Views Left  Result Date: 10/22/2019 CLINICAL DATA:  Pain, recent injection in the thigh EXAM: LEFT FEMUR 2 VIEWS COMPARISON:  None. FINDINGS: There is no evidence of fracture or other focal bone lesions. Mild arthrosis of the  included hip joint. Soft tissues are unremarkable. IMPRESSION: No radiographic abnormality of the left femur. Electronically Signed   By: Lauralyn Primes M.D.   On: 10/22/2019 16:20   US Abdomen Limited RUQ  Result Date: 10/22/2019 CLINICAL DATA:  Abnormal labs, ETOH EXAM: ULTRASOUND ABDOMEN LIMITED RIGHT UPPER QUADRANT COMPARISON:  None. FINDINGS: Gallbladder: Layering sludge in the gallbladder. Trace, nonspecific pericholecystic fluid. No discrete gallstones or wall thickening visualized. No sonographic Murphy sign noted  by sonographer. Common bile duct: Diameter: 3 mm Liver: No focal lesion identified. Somewhat coarse parenchymal echogenicity. Intrahepatic biliary ductal dilatation. Portal vein is patent on color Doppler imaging with normal direction of blood flow towards the liver. Other: None. IMPRESSION: 1. Intrahepatic biliary ductal dilatation, of uncertain etiology. No dilatation of the common bile duct or obstructing calculus or other lesion identified. 2. Layering sludge in the gallbladder without discrete calculi identified. Trace, nonspecific pericholecystic fluid. No gallbladder wall thickening. Negative sonographic Murphy sign. 3. Somewhat coarse parenchymal echogenicity suggestive of chronic liver disease without overt stigmata of cirrhosis. 4.  Consider CT or MRI to further evaluate. Electronically Signed   By: Lauralyn Primes M.D.   On: 10/22/2019 17:45    Procedures Procedures (including critical care time)  Medications Ordered in ED Medications  diazepam (VALIUM) tablet 2 mg (2 mg Oral Given 10/22/19 1623)  ketorolac (TORADOL) injection 30 mg (30 mg Intramuscular Given 10/22/19 1623)    ED Course  I have reviewed the triage vital signs and the nursing notes.  Pertinent labs & imaging results that were available during my care of the patient were reviewed by me and considered in my medical decision making (see chart for details).    MDM Rules/Calculators/A&P                       43 year old male presented to ER with complaints of left buttock/left upper leg pain.  Reportedly near the site where he received some sort of IM injection in the eye doctor's office though he is unsure what this was.  There is no evidence for abscess, cellulitis.  He does have a bruise over the posterior aspect of his upper thigh and lower leg.  X-rays were negative, bearing weight without difficulty.  For this issue do not feel any additional work-up needed at this time.  Lab work notable for incidentally noted elevated T bili.  On further questioning wife has noticed some yellowing in his eyes.  He has no other GI symptoms.  Ultrasound was notable for intrahepatic biliary ductal dilatation, gallbladder sludge.  No evidence for acute cholecystitis.  Reviewed lab findings, ultrasound findings with on-call gastroenterology.  Dr. Ewing Schlein. Given current presentation, he felt patient was more than appropriate for outpatient management and further work-up.  Recommended obtaining MRCP, could follow-up with either primary or with GI.  Patient and wife agreeable.  Reviewed return precautions, discharged home.    After the discussed management above, the patient was determined to be safe for discharge.  The patient was in agreement with this plan and all questions regarding their care were answered.  ED return precautions were discussed and the patient will return to the ED with any significant worsening of condition.   Final Clinical Impression(s) / ED Diagnoses Final diagnoses:  Left flank pain  Serum total bilirubin elevated  Intrahepatic bile duct dilation    Rx / DC Orders ED Discharge Orders    None       Milagros Loll, MD 10/22/19 2256

## 2019-10-22 NOTE — ED Triage Notes (Addendum)
States he was seen by his MD and got an injection on Monday. He is here for pain at the injection site in his left vastis lateralis. Pt also has a large bruise to the back of his left knee to his thigh. No injury.

## 2019-10-22 NOTE — Discharge Instructions (Signed)
Recommend follow-up with your primary doctor as well as gastroenterology regarding the blood findings today.  Please obtain the outpatient MRI as discussed to further evaluate your gallbladder and liver.  If you develop fever, vomiting, abdominal pain or other new concerning symptom, return to ER for reassessment.

## 2020-10-31 ENCOUNTER — Emergency Department (HOSPITAL_BASED_OUTPATIENT_CLINIC_OR_DEPARTMENT_OTHER): Payer: 59

## 2020-10-31 ENCOUNTER — Encounter (HOSPITAL_BASED_OUTPATIENT_CLINIC_OR_DEPARTMENT_OTHER): Payer: Self-pay | Admitting: *Deleted

## 2020-10-31 ENCOUNTER — Other Ambulatory Visit: Payer: Self-pay

## 2020-10-31 ENCOUNTER — Inpatient Hospital Stay (HOSPITAL_BASED_OUTPATIENT_CLINIC_OR_DEPARTMENT_OTHER)
Admission: EM | Admit: 2020-10-31 | Discharge: 2020-11-03 | DRG: 434 | Disposition: A | Discharge status: Home / Self Care | Payer: 59 | Attending: Internal Medicine | Admitting: Internal Medicine

## 2020-10-31 DIAGNOSIS — R791 Abnormal coagulation profile: Secondary | ICD-10-CM | POA: Diagnosis present

## 2020-10-31 DIAGNOSIS — D649 Anemia, unspecified: Secondary | ICD-10-CM | POA: Diagnosis present

## 2020-10-31 DIAGNOSIS — F17210 Nicotine dependence, cigarettes, uncomplicated: Secondary | ICD-10-CM | POA: Diagnosis present

## 2020-10-31 DIAGNOSIS — D696 Thrombocytopenia, unspecified: Secondary | ICD-10-CM | POA: Diagnosis present

## 2020-10-31 DIAGNOSIS — K7010 Alcoholic hepatitis without ascites: Secondary | ICD-10-CM | POA: Diagnosis present

## 2020-10-31 DIAGNOSIS — R7401 Elevation of levels of liver transaminase levels: Secondary | ICD-10-CM

## 2020-10-31 DIAGNOSIS — K838 Other specified diseases of biliary tract: Secondary | ICD-10-CM | POA: Diagnosis present

## 2020-10-31 DIAGNOSIS — D539 Nutritional anemia, unspecified: Secondary | ICD-10-CM | POA: Diagnosis present

## 2020-10-31 DIAGNOSIS — R17 Unspecified jaundice: Secondary | ICD-10-CM | POA: Diagnosis not present

## 2020-10-31 DIAGNOSIS — F1020 Alcohol dependence, uncomplicated: Secondary | ICD-10-CM | POA: Diagnosis present

## 2020-10-31 DIAGNOSIS — D638 Anemia in other chronic diseases classified elsewhere: Secondary | ICD-10-CM | POA: Diagnosis present

## 2020-10-31 DIAGNOSIS — Z20822 Contact with and (suspected) exposure to covid-19: Secondary | ICD-10-CM | POA: Diagnosis present

## 2020-10-31 DIAGNOSIS — K828 Other specified diseases of gallbladder: Secondary | ICD-10-CM | POA: Diagnosis present

## 2020-10-31 DIAGNOSIS — K709 Alcoholic liver disease, unspecified: Secondary | ICD-10-CM

## 2020-10-31 DIAGNOSIS — K7030 Alcoholic cirrhosis of liver without ascites: Secondary | ICD-10-CM | POA: Diagnosis present

## 2020-10-31 LAB — AMMONIA: Ammonia: 61 umol/L — ABNORMAL HIGH (ref 9–35)

## 2020-10-31 LAB — HEPATITIS PANEL, ACUTE
HCV Ab: NONREACTIVE
Hep A IgM: NONREACTIVE
Hep B C IgM: NONREACTIVE
Hepatitis B Surface Ag: NONREACTIVE

## 2020-10-31 LAB — PROTIME-INR
INR: 2.1 — ABNORMAL HIGH (ref 0.8–1.2)
Prothrombin Time: 23.5 seconds — ABNORMAL HIGH (ref 11.4–15.2)

## 2020-10-31 LAB — CBC WITH DIFFERENTIAL/PLATELET
Abs Immature Granulocytes: 0.05 10*3/uL (ref 0.00–0.07)
Basophils Absolute: 0.1 10*3/uL (ref 0.0–0.1)
Basophils Relative: 2 %
Eosinophils Absolute: 0.1 10*3/uL (ref 0.0–0.5)
Eosinophils Relative: 2 %
HCT: 22.3 % — ABNORMAL LOW (ref 39.0–52.0)
Hemoglobin: 7.2 g/dL — ABNORMAL LOW (ref 13.0–17.0)
Immature Granulocytes: 1 %
Lymphocytes Relative: 36 %
Lymphs Abs: 2.1 10*3/uL (ref 0.7–4.0)
MCH: 40.4 pg — ABNORMAL HIGH (ref 26.0–34.0)
MCHC: 32.3 g/dL (ref 30.0–36.0)
MCV: 125.3 fL — ABNORMAL HIGH (ref 80.0–100.0)
Monocytes Absolute: 0.7 10*3/uL (ref 0.1–1.0)
Monocytes Relative: 13 %
Neutro Abs: 2.7 10*3/uL (ref 1.7–7.7)
Neutrophils Relative %: 46 %
Platelets: 98 10*3/uL — ABNORMAL LOW (ref 150–400)
RBC: 1.78 MIL/uL — ABNORMAL LOW (ref 4.22–5.81)
RDW: 16.4 % — ABNORMAL HIGH (ref 11.5–15.5)
Smear Review: DECREASED
WBC: 5.8 10*3/uL (ref 4.0–10.5)
nRBC: 0.3 % — ABNORMAL HIGH (ref 0.0–0.2)

## 2020-10-31 LAB — RESP PANEL BY RT-PCR (FLU A&B, COVID) ARPGX2
Influenza A by PCR: NEGATIVE
Influenza B by PCR: NEGATIVE
SARS Coronavirus 2 by RT PCR: NEGATIVE

## 2020-10-31 LAB — COMPREHENSIVE METABOLIC PANEL
ALT: 27 U/L (ref 0–44)
AST: 100 U/L — ABNORMAL HIGH (ref 15–41)
Albumin: 2.7 g/dL — ABNORMAL LOW (ref 3.5–5.0)
Alkaline Phosphatase: 99 U/L (ref 38–126)
Anion gap: 15 (ref 5–15)
BUN: <5 mg/dL — ABNORMAL LOW (ref 6–20)
CO2: 22 mmol/L (ref 22–32)
Calcium: 8.6 mg/dL — ABNORMAL LOW (ref 8.9–10.3)
Chloride: 98 mmol/L (ref 98–111)
Creatinine, Ser: 0.42 mg/dL — ABNORMAL LOW (ref 0.61–1.24)
GFR, Estimated: >60 mL/min (ref >60)
Glucose, Bld: 107 mg/dL — ABNORMAL HIGH (ref 70–99)
Potassium: 4 mmol/L (ref 3.5–5.1)
Sodium: 135 mmol/L (ref 135–145)
Total Bilirubin: 10.6 mg/dL — ABNORMAL HIGH (ref 0.3–1.2)
Total Protein: 8.6 g/dL — ABNORMAL HIGH (ref 6.5–8.1)

## 2020-10-31 LAB — LIPASE, BLOOD: Lipase: 49 U/L (ref 11–51)

## 2020-10-31 LAB — BILIRUBIN, DIRECT: Bilirubin, Direct: 3.6 mg/dL — ABNORMAL HIGH (ref 0.0–0.2)

## 2020-10-31 NOTE — ED Notes (Signed)
US at bedside

## 2020-10-31 NOTE — ED Notes (Signed)
Spoke with kirstin in lab to add on Protime-INR

## 2020-10-31 NOTE — ED Provider Notes (Signed)
[facility] EMERGENCY DEPARTMENT Provider Note   CSN: [id number] Arrival date & time: 10/31/20  1350     History Chief Complaint  Patient presents with  . abnormal labs    Seth Holmes is a 45 y.o. male with past medical history significant for H. pylori who presents the ED sent from [facility] for anemia.  I reviewed patient's medical record and he was evaluated on 09/04/2019 and diagnosed with peripheral edema, macrocytic anemia, and transaminitis.  It was likely that he had anemia of chronic disease due to his excessive alcohol use.  He was discharged home with furosemide, potassium supplements, vitamin B12, and folic acid.  On my examination, patient is alert and answering questions appropriately.  He states that he typically goes to [facility] walk-in clinics and does not have a primary care provider.  He continues to endorse 6-10 alcoholic beverages daily.  Patient states that a couple of months ago he developed yellow eyes and worsening swelling in his left lower leg.  He states that he has been having intermittent lower extremity edema bilaterally for years, but it has been worse in the left leg for the past couple of months.  He denies any chest pain, hemoptysis, cough, shortness of breath, fevers or chills, abdominal pain, nausea or vomiting, or any other symptoms.  His last bowel movement was yesterday, brown.  Denies melena or hematochezia.  HPI     Past Medical History:  Diagnosis Date  . H. pylori infection     Patient Active Problem List   Diagnosis Date Noted  . Hyperbilirubinemia 10/31/2020    Past Surgical History:  Procedure Laterality Date  . APPENDECTOMY    . COLONOSCOPY W/ BIOPSIES AND POLYPECTOMY         No family history on file.  Social History   Tobacco Use  . Smoking status: Current Every Day Smoker    Packs/day: 0.50    Types: Cigarettes  . Smokeless tobacco: Never Used  Substance Use Topics  . Alcohol  use: Yes  . Drug use: No    Home Medications Prior to Admission medications   Medication Sig Start Date End Date Taking? Authorizing Provider  folic acid (V-R FOLIC ACID) 400 MCG tablet Take 1 tablet (400 mcg total) by mouth daily. 09/04/19   Raeford Razor, MD  furosemide (LASIX) 20 MG tablet Take 1 tablet (20 mg total) by mouth 2 (two) times daily. 09/04/19   Raeford Razor, MD  HYDROcodone-acetaminophen (NORCO/VICODIN) 5-325 MG tablet Take 1 tablet by mouth every 6 (six) hours as needed for severe pain. 11/23/17   Mesner, Barbara Cower, MD  ibuprofen (ADVIL,MOTRIN) 800 MG tablet Take 1 tablet (800 mg total) by mouth 3 (three) times daily. 11/23/17   Mesner, Barbara Cower, MD  potassium chloride SA (KLOR-CON) 20 MEQ tablet Take 1 tablet (20 mEq total) by mouth daily. 09/04/19   Raeford Razor, MD  vitamin B-12 (CYANOCOBALAMIN) 100 MCG tablet Take 1 tablet (100 mcg total) by mouth daily. 09/04/19   Raeford Razor, MD    Allergies    Patient has no known allergies.  Review of Systems   Review of Systems  All other systems reviewed and are negative.   Physical Exam Updated Vital Signs BP 112/73   Pulse 72   Temp 98.6 F (37 C) (Oral)   Resp 12   Ht 5\' 9"  (1.753 m)   Wt 74.8 kg   SpO2 97%   BMI 24.37 kg/m   Physical Exam Vitals  and nursing note reviewed. Exam conducted with a chaperone present.  Constitutional:      General: He is not in acute distress.    Appearance: He is not toxic-appearing.  HENT:     Head: Normocephalic and atraumatic.  Eyes:     General: Scleral icterus present.     Conjunctiva/sclera: Conjunctivae normal.  Cardiovascular:     Rate and Rhythm: Normal rate and regular rhythm.     Pulses: Normal pulses.  Pulmonary:     Effort: Pulmonary effort is normal. No respiratory distress.  Abdominal:     General: Abdomen is flat. There is no distension.     Palpations: Abdomen is soft. There is mass.     Tenderness: There is no abdominal tenderness.     Comments: Hepatomegaly  noted.  Mild ventral hernia versus diastases recti.  Entire abdomen is soft, nontender.  No guarding.  No overlying skin changes.  Musculoskeletal:     Cervical back: Normal range of motion. No rigidity.     Right lower leg: Edema present.     Left lower leg: Edema present.     Comments: Bilateral peripheral edema, much worse in left lower extremity.  Skin:    General: Skin is dry.     Coloration: Skin is jaundiced.  Neurological:     General: No focal deficit present.     Mental Status: He is alert and oriented to person, place, and time.     GCS: GCS eye subscore is 4. GCS verbal subscore is 5. GCS motor subscore is 6.  Psychiatric:        Mood and Affect: Mood normal.        Behavior: Behavior normal.        Thought Content: Thought content normal.     ED Results / Procedures / Treatments   Labs (all labs ordered are listed, but only abnormal results are displayed) Labs Reviewed  CBC WITH DIFFERENTIAL/PLATELET - Abnormal; Notable for the following components:      Result Value   RBC 1.78 (*)    Hemoglobin 7.2 (*)    HCT 22.3 (*)    MCV 125.3 (*)    MCH 40.4 (*)    RDW 16.4 (*)    Platelets 98 (*)    nRBC 0.3 (*)    All other components within normal limits  COMPREHENSIVE METABOLIC PANEL - Abnormal; Notable for the following components:   Glucose, Bld 107 (*)    BUN <5 (*)    Creatinine, Ser 0.42 (*)    Calcium 8.6 (*)    Total Protein 8.6 (*)    Albumin 2.7 (*)    AST 100 (*)    Total Bilirubin 10.6 (*)    All other components within normal limits  AMMONIA - Abnormal; Notable for the following components:   Ammonia 61 (*)    All other components within normal limits  BILIRUBIN, DIRECT - Abnormal; Notable for the following components:   Bilirubin, Direct 3.6 (*)    All other components within normal limits  PROTIME-INR - Abnormal; Notable for the following components:   Prothrombin Time 23.5 (*)    INR 2.1 (*)    All other components within normal limits   CULTURE, BLOOD (ROUTINE X 2)  CULTURE, BLOOD (ROUTINE X 2)  URINE CULTURE  RESP PANEL BY RT-PCR (FLU A&B, COVID) ARPGX2  LIPASE, BLOOD  HEPATITIS PANEL, ACUTE    EKG None  Radiology US Abdomen Limited  Result Date: 10/31/2020 CLINICAL  DATA:  Transaminitis with elevated bilirubin.  Alcoholic. EXAM: ULTRASOUND ABDOMEN LIMITED RIGHT UPPER QUADRANT COMPARISON:  Abdominal ultrasound 10/22/2019 FINDINGS: Gallbladder: Distended containing layering sludge. No gallstones or wall thickening visualized. No sonographic Murphy sign noted by sonographer. Common bile duct: Diameter: 8-9 mm. Liver: Heterogeneous and coarsened parenchymal echogenicity. Borderline increased compared to right kidney. No definite capsular nodularity. Previous intrahepatic biliary ductal dilatation is not as well appreciated currently. Portal vein is patent on color Doppler imaging with normal direction of blood flow towards the liver. Other: No right upper quadrant ascites. IMPRESSION: 1. Distended gallbladder containing layering sludge. No sonographic findings of acute cholecystitis. 2. Dilated common bile duct at 9 mm, cause not delineated. Previous intrahepatic biliary ductal dilatation is less well appreciated on the current exam. 3. Coarsened hepatic echogenicity with borderline increased compared to right kidney, suggestive of chronic liver disease. Electronically Signed   By: Narda Rutherford M.D.   On: 10/31/2020 18:28   US Venous Img Lower  Left (DVT Study)  Result Date: 10/31/2020 CLINICAL DATA:  Left leg swelling EXAM: Bilateral LOWER EXTREMITY VENOUS DOPPLER ULTRASOUND TECHNIQUE: Gray-scale sonography with compression, as well as color and duplex ultrasound, were performed to evaluate the deep venous system(s) from the level of the common femoral vein through the popliteal and proximal calf veins. COMPARISON:  None. FINDINGS: VENOUS Normal compressibility of the common femoral, superficial femoral, and popliteal veins,  as well as the visualized calf veins. Visualized portions of profunda femoral vein and great saphenous vein unremarkable. No filling defects to suggest DVT on grayscale or color Doppler imaging. Doppler waveforms show normal direction of venous flow, normal respiratory plasticity and response to augmentation. OTHER None. Limitations: none IMPRESSION: Negative. Electronically Signed   By: Jasmine Pang M.D.   On: 10/31/2020 18:26   DG Chest Portable 1 View  Result Date: 10/31/2020 CLINICAL DATA:  Anemia, elevated liver function tests EXAM: PORTABLE CHEST 1 VIEW COMPARISON:  11/24/2012 FINDINGS: The heart size and mediastinal contours are within normal limits. Both lungs are clear. The visualized skeletal structures are unremarkable. IMPRESSION: No active disease. Electronically Signed   By: Sharlet Salina M.D.   On: 10/31/2020 19:31    Procedures Procedures   Medications Ordered in ED Medications - No data to display  ED Course  I have reviewed the triage vital signs and the nursing notes.  Pertinent labs & imaging results that were available during my care of the patient were reviewed by me and considered in my medical decision making (see chart for details).  Clinical Course as of 10/31/20 1937  Tue Oct 31, 2020  1913 I spoke with Dr. Presley Raddle gastroenterology, who recommends hospitalist admission over at [facility] for alcohol hepatitis and ERCP versus MRCP in the morning.  She asked that we obtain blood cultures, urine culture, and chest x-ray to rule out infection before initiating prednisone treatment tomorrow. [GG]  1936 I spoke with Dr. Rachael Darby who will admit patient to [facility] [GG]    Clinical Course User Index [GG] Lorelee New, PA-C   MDM Rules/Calculators/A&P                          Seth Holmes was evaluated in Emergency Department on 10/31/2020 for the symptoms described in the history of present illness. He was evaluated in the context of the global COVID-19  pandemic, which necessitated consideration that the patient might be at risk for infection with the SARS-CoV-2 virus that causes  COVID-19. Institutional protocols and algorithms that pertain to the evaluation of patients at risk for COVID-19 are in a state of rapid change based on information released by regulatory bodies including the CDC and federal and state organizations. These policies and algorithms were followed during the patient's care in the ED.  I personally reviewed patient's medical chart and all notes from triage and staff during today's encounter. I have also ordered and reviewed all labs and imaging that I felt to be medically necessary in the evaluation of this patient's complaints and with consideration of their physical exam. If needed, translation services were available and utilized.   Patient with hemoglobin reduced to 7.2, continually downtrending from baseline of around 9.  It is macrocytic, likely anemia of chronic disease in context of significant alcohol abuse.  Will prescribe patient folic acid and vitamin B12.  Encouraged weaning slowly from his alcohol use.    CMP notable for mild transaminitis with AST of 100, AST to ALT ratio greater than 2:1, suggestive that this is alcohol induced.  Patient does have total bilirubin elevated 10.6, increased from6.4 labs obtained 1 year ago.  This is likely representative of a slow, progressive increased rather than an acute process.  I have lower suspicion for CBD obstruction.  Direct bilirubin of 3.6 suggest that this is intrahepatic.  Hepatomegaly on my abdominal exam in addition to his alcohol use disorder suggests this is likely related to his cirrhosis.  Ultrasound of right upper quadrant demonstrates distended gallbladder containing layering sludge.  No sonographic findings of acute cholecystitis.  There is also a dilated common bile duct at 9 mm, cause not well delineated.  Liver findings consistent with chronic liver disease.  I  agree that this is likely chronic progressive jaundice and liver impairment.  He denies any recent abdominal pain, fevers or chills, nausea or vomiting, or other symptoms suggestive of acute cholecystitis.  However, I believe that he would benefit from ERCP or MRCP for better evaluation of CBD.  DVT study left leg unremarkable.  Peripheral edema charted in the past.  Exam not reflective of cellulitis.  Recommending compression stockings and outpatient follow-up.  [facility]arki[facility], who [facility] for [facility] hepatitis and ERCP versus MRCP in the morning.  She asked that we obtain blood cultures, urine culture, and chest x-ray to rule out infection before initiating prednisolone treatment tomorrow.  I spoke with Dr. Rachael Darbyhotiner who [facility].   Final Clinical Impression(s) / ED Diagnoses Final diagnoses:  Jaundice  Common bile duct dilation  Alcohol use disorder, severe, dependence [facility](HCC)    Rx / DC Orders ED Discharge Orders    None       Lorelee NewGreen, Jaran Sainz L, PA-C 10/31/20 1937    Rolan BuccoBelfi, Melanie, MD 11/18/20 [id number]

## 2020-10-31 NOTE — ED Notes (Signed)
Spoke with Cala Bradford in lab to add on bilirubin direct

## 2020-10-31 NOTE — ED Notes (Signed)
Pt on 5 lead, b/p and POx

## 2020-10-31 NOTE — ED Triage Notes (Signed)
Sent here from [facility] for HGB 7.0 and increase liver enzymes

## 2020-10-31 NOTE — ED Notes (Signed)
Pt requesting ginger ale to drink ok per RN Morrie Sheldon. Pt given the same

## 2020-10-31 NOTE — ED Notes (Signed)
Assumed care of this patient. Vitals taken. A&Ox4. Respirations regular/unlabored. PIV obtained. Connected to cardiac monitor, BP and pulse ox. Strecther low, wheels locked, call bell within reach. Family at bedside.

## 2020-11-01 DIAGNOSIS — F1020 Alcohol dependence, uncomplicated: Secondary | ICD-10-CM

## 2020-11-01 DIAGNOSIS — K838 Other specified diseases of biliary tract: Secondary | ICD-10-CM

## 2020-11-01 DIAGNOSIS — K709 Alcoholic liver disease, unspecified: Secondary | ICD-10-CM

## 2020-11-01 DIAGNOSIS — R7401 Elevation of levels of liver transaminase levels: Secondary | ICD-10-CM

## 2020-11-01 DIAGNOSIS — R791 Abnormal coagulation profile: Secondary | ICD-10-CM

## 2020-11-01 DIAGNOSIS — D539 Nutritional anemia, unspecified: Secondary | ICD-10-CM

## 2020-11-01 LAB — COMPREHENSIVE METABOLIC PANEL
ALT: 25 U/L (ref 0–44)
AST: 87 U/L — ABNORMAL HIGH (ref 15–41)
Albumin: 2.8 g/dL — ABNORMAL LOW (ref 3.5–5.0)
Alkaline Phosphatase: 97 U/L (ref 38–126)
Anion gap: 10 (ref 5–15)
BUN: <5 mg/dL — ABNORMAL LOW (ref 6–20)
CO2: 25 mmol/L (ref 22–32)
Calcium: 8.7 mg/dL — ABNORMAL LOW (ref 8.9–10.3)
Chloride: 101 mmol/L (ref 98–111)
Creatinine, Ser: 0.49 mg/dL — ABNORMAL LOW (ref 0.61–1.24)
GFR, Estimated: >60 mL/min (ref >60)
Glucose, Bld: 93 mg/dL (ref 70–99)
Potassium: 3.6 mmol/L (ref 3.5–5.1)
Sodium: 136 mmol/L (ref 135–145)
Total Bilirubin: 11.5 mg/dL — ABNORMAL HIGH (ref 0.3–1.2)
Total Protein: 8.6 g/dL — ABNORMAL HIGH (ref 6.5–8.1)

## 2020-11-01 LAB — IRON AND TIBC
Iron: 126 ug/dL (ref 45–182)
Saturation Ratios: 89 % — ABNORMAL HIGH (ref 17.9–39.5)
TIBC: 142 ug/dL — ABNORMAL LOW (ref 250–450)
UIBC: 16 ug/dL

## 2020-11-01 LAB — CBC
HCT: 22.3 % — ABNORMAL LOW (ref 39.0–52.0)
Hemoglobin: 7.1 g/dL — ABNORMAL LOW (ref 13.0–17.0)
MCH: 40.3 pg — ABNORMAL HIGH (ref 26.0–34.0)
MCHC: 31.8 g/dL (ref 30.0–36.0)
MCV: 126.7 fL — ABNORMAL HIGH (ref 80.0–100.0)
Platelets: 99 10*3/uL — ABNORMAL LOW (ref 150–400)
RBC: 1.76 MIL/uL — ABNORMAL LOW (ref 4.22–5.81)
RDW: 16.5 % — ABNORMAL HIGH (ref 11.5–15.5)
WBC: 5.4 10*3/uL (ref 4.0–10.5)
nRBC: 0.4 % — ABNORMAL HIGH (ref 0.0–0.2)

## 2020-11-01 LAB — FOLATE: Folate: 7.4 ng/mL (ref >5.9)

## 2020-11-01 LAB — ABO/RH: ABO/RH(D): O POS

## 2020-11-01 LAB — VITAMIN B12: Vitamin B-12: 641 pg/mL (ref 180–914)

## 2020-11-01 LAB — HIV ANTIBODY (ROUTINE TESTING W REFLEX): HIV Screen 4th Generation wRfx: NONREACTIVE

## 2020-11-01 MED ORDER — ONDANSETRON HCL 4 MG/2ML IJ SOLN
4.0000 mg | Freq: Four times a day (QID) | INTRAMUSCULAR | Status: DC | PRN
  Administered 2020-11-01: 4 mg via INTRAVENOUS
  Filled 2020-11-01: qty 2

## 2020-11-01 MED ORDER — SODIUM CHLORIDE 0.9 % IV SOLN: INTRAVENOUS | Status: AC

## 2020-11-01 MED ORDER — FOLIC ACID 1 MG PO TABS
1.0000 mg | ORAL_TABLET | Freq: Every day | ORAL | Status: DC
  Administered 2020-11-02 – 2020-11-03 (×2): 1 mg via ORAL
  Filled 2020-11-01 (×2): qty 1

## 2020-11-01 MED ORDER — LORAZEPAM 2 MG/ML IJ SOLN
1.0000 mg | INTRAMUSCULAR | Status: DC | PRN
  Administered 2020-11-01: 1 mg via INTRAVENOUS
  Filled 2020-11-01: qty 1

## 2020-11-01 MED ORDER — PREDNISOLONE 5 MG PO TABS
40.0000 mg | ORAL_TABLET | Freq: Every day | ORAL | Status: DC
  Administered 2020-11-01 – 2020-11-03 (×3): 40 mg via ORAL
  Filled 2020-11-01 (×3): qty 8

## 2020-11-01 MED ORDER — HYDROXYZINE HCL 25 MG PO TABS
25.0000 mg | ORAL_TABLET | Freq: Once | ORAL | Status: AC
  Administered 2020-11-01: 25 mg via ORAL
  Filled 2020-11-01: qty 1

## 2020-11-01 MED ORDER — PANTOPRAZOLE SODIUM 40 MG IV SOLR
40.0000 mg | Freq: Two times a day (BID) | INTRAVENOUS | Status: DC
  Administered 2020-11-01 (×2): 40 mg via INTRAVENOUS
  Filled 2020-11-01 (×3): qty 40

## 2020-11-01 MED ORDER — LORAZEPAM 1 MG PO TABS
1.0000 mg | ORAL_TABLET | ORAL | Status: DC | PRN
Start: 2020-11-01 — End: 2020-11-03

## 2020-11-01 MED ORDER — THIAMINE HCL 100 MG PO TABS
100.0000 mg | ORAL_TABLET | Freq: Every day | ORAL | Status: DC
  Administered 2020-11-02 – 2020-11-03 (×2): 100 mg via ORAL
  Filled 2020-11-01 (×2): qty 1

## 2020-11-01 NOTE — Consult Note (Addendum)
Referring Provider: [facility] Primary Care Physician:  [facility] Primary Gastroenterologist:  Gentry Fitz  Reason for Consultation: Abnormal LFTs  HPI: Seth Holmes is a 45 y.o. male with history of alcohol use presenting for consultation of abnormal LFTs.  Patient states he presented to [facility] ED due to lower extremity swelling.  He was found to have elevated bilirubin.  He denies any abdominal pain, nausea, vomiting, dysphagia, changes in bowel habits, melena, or hematochezia.  He reports poor appetite over the last several weeks and thinks he may have lost weight.  He drinks approximately 6 beers per day.  Denies any IV drug use.  Denies any new prescription, OTC, or herbal medications.  Has had tattoos in the past.  Denies any family history of liver disease, colon cancer, or gastrointestinal malignancy.   Past Medical History:  Diagnosis Date  . H. pylori infection     Past Surgical History:  Procedure Laterality Date  . APPENDECTOMY    . COLONOSCOPY W/ BIOPSIES AND POLYPECTOMY      Prior to Admission medications   Medication Sig Start Date End Date Taking? Authorizing Provider  folic acid (V-R FOLIC ACID) 400 MCG tablet Take 1 tablet (400 mcg total) by mouth daily. Patient not taking: Reported on 11/01/2020 09/04/19   Raeford Razor, MD  furosemide (LASIX) 20 MG tablet Take 1 tablet (20 mg total) by mouth 2 (two) times daily. Patient not taking: Reported on 11/01/2020 09/04/19   Raeford Razor, MD  HYDROcodone-acetaminophen (NORCO/VICODIN) 5-325 MG tablet Take 1 tablet by mouth every 6 (six) hours as needed for severe pain. Patient not taking: Reported on 11/01/2020 11/23/17   Mesner, Barbara Cower, MD  ibuprofen (ADVIL,MOTRIN) 800 MG tablet Take 1 tablet (800 mg total) by mouth 3 (three) times daily. Patient not taking: Reported on 11/01/2020 11/23/17   Mesner, Barbara Cower, MD  potassium chloride SA (KLOR-CON) 20 MEQ tablet Take 1 tablet (20 mEq total) by mouth  daily. Patient not taking: Reported on 11/01/2020 09/04/19   Raeford Razor, MD  vitamin B-12 (CYANOCOBALAMIN) 100 MCG tablet Take 1 tablet (100 mcg total) by mouth daily. Patient not taking: Reported on 11/01/2020 09/04/19   Raeford Razor, MD    Scheduled Meds: . Melene Muller ON 11/02/2020] folic acid  1 mg Oral Daily  . [START ON 11/02/2020] thiamine  100 mg Oral Daily   Continuous Infusions: PRN Meds:.LORazepam **OR** LORazepam, ondansetron (ZOFRAN) IV  Allergies as of 10/31/2020  . (No Known Allergies)    No family history on file.  Social History   Socioeconomic History  . Marital status: Married    Spouse name: Not on file  . Number of children: Not on file  . Years of education: Not on file  . Highest education level: Not on file  Occupational History  . Not on file  Tobacco Use  . Smoking status: Current Every Day Smoker    Packs/day: 0.50    Types: Cigarettes  . Smokeless tobacco: Never Used  Substance and Sexual Activity  . Alcohol use: Yes  . Drug use: No  . Sexual activity: Not on file  Other Topics Concern  . Not on file  Social History Narrative  . Not on file   Social Determinants of Health   Financial Resource Strain: Not on file  Food Insecurity: Not on file  Transportation Needs: Not on file  Physical Activity: Not on file  Stress: Not on file  Social Connections: Not on file  Intimate Partner Violence: Not on  file    Review of Systems: Review of Systems  Constitutional: Negative for chills and fever.  HENT: Negative for hearing loss and tinnitus.   Eyes: Negative for pain and redness.  Respiratory: Negative for cough and shortness of breath.   Cardiovascular: Positive for leg swelling. Negative for chest pain.  Gastrointestinal: Negative for abdominal pain, blood in stool, constipation, diarrhea, heartburn, melena, nausea and vomiting.  Genitourinary: Negative for flank pain and hematuria.  Musculoskeletal: Negative for back pain and joint pain.   Skin: Negative for itching and rash.  Neurological: Negative for seizures and loss of consciousness.  Endo/Heme/Allergies: Negative for polydipsia. Does not bruise/bleed easily.  Psychiatric/Behavioral: Positive for substance abuse. The patient is not nervous/anxious.      Physical Exam: Vital signs: Vitals:   11/01/20 0710 11/01/20 1106  BP: 134/68 (!) 106/58  Pulse: 72 67  Resp: 20 16  Temp: 99 F (37.2 C) 98.8 F (37.1 C)  SpO2: 99% 99%   Last BM Date: 10/31/20  Physical Exam Vitals reviewed.  Constitutional:      General: He is not in acute distress. HENT:     Head: Normocephalic and atraumatic.     Nose: Nose normal. No congestion.     Mouth/Throat:     Mouth: Mucous membranes are moist.     Pharynx: Oropharynx is clear.  Eyes:     General: Scleral icterus present.     Extraocular Movements: Extraocular movements intact.  Cardiovascular:     Rate and Rhythm: Normal rate and regular rhythm.     Pulses: Normal pulses.  Pulmonary:     Effort: Pulmonary effort is normal. No respiratory distress.     Breath sounds: Normal breath sounds.  Abdominal:     General: Bowel sounds are normal. There is no distension.     Palpations: Abdomen is soft. There is no mass.     Tenderness: There is no abdominal tenderness. There is no guarding or rebound.     Hernia: No hernia is present.  Musculoskeletal:        General: No tenderness.     Cervical back: Normal range of motion and neck supple.     Right lower leg: Edema present.     Left lower leg: Edema present.  Skin:    General: Skin is warm and dry.     Coloration: Skin is jaundiced.  Neurological:     General: No focal deficit present.     Mental Status: He is alert and oriented to person, place, and time.  Psychiatric:        Mood and Affect: Mood normal.        Behavior: Behavior normal. Behavior is cooperative.     GI:  Lab Results: Recent Labs    10/31/20 1446 11/01/20 0125  WBC 5.8 5.4  HGB 7.2* 7.1*   HCT 22.3* 22.3*  PLT 98* 99*   BMET Recent Labs    10/31/20 1446 11/01/20 0125  NA 135 136  K 4.0 3.6  CL 98 101  CO2 22 25  GLUCOSE 107* 93  BUN <5* <5*  CREATININE 0.42* 0.49*  CALCIUM 8.6* 8.7*   LFT Recent Labs    10/31/20 1446 11/01/20 0125  PROT 8.6* 8.6*  ALBUMIN 2.7* 2.8*  AST 100* 87*  ALT 27 25  ALKPHOS 99 97  BILITOT 10.6* 11.5*  BILIDIR 3.6*  --    PT/INR Recent Labs    10/31/20 1446  LABPROT 23.5*  INR 2.1*  Studies/Results: US Abdomen Limited  Result Date: 10/31/2020 CLINICAL DATA:  Transaminitis with elevated bilirubin.  Alcoholic. EXAM: ULTRASOUND ABDOMEN LIMITED RIGHT UPPER QUADRANT COMPARISON:  Abdominal ultrasound 10/22/2019 FINDINGS: Gallbladder: Distended containing layering sludge. No gallstones or wall thickening visualized. No sonographic Murphy sign noted by sonographer. Common bile duct: Diameter: 8-9 mm. Liver: Heterogeneous and coarsened parenchymal echogenicity. Borderline increased compared to right kidney. No definite capsular nodularity. Previous intrahepatic biliary ductal dilatation is not as well appreciated currently. Portal vein is patent on color Doppler imaging with normal direction of blood flow towards the liver. Other: No right upper quadrant ascites. IMPRESSION: 1. Distended gallbladder containing layering sludge. No sonographic findings of acute cholecystitis. 2. Dilated common bile duct at 9 mm, cause not delineated. Previous intrahepatic biliary ductal dilatation is less well appreciated on the current exam. 3. Coarsened hepatic echogenicity with borderline increased compared to right kidney, suggestive of chronic liver disease. Electronically Signed   By: Narda Rutherford M.D.   On: 10/31/2020 18:28   US Venous Img Lower  Left (DVT Study)  Result Date: 10/31/2020 CLINICAL DATA:  Left leg swelling EXAM: Bilateral LOWER EXTREMITY VENOUS DOPPLER ULTRASOUND TECHNIQUE: Gray-scale sonography with compression, as well as color  and duplex ultrasound, were performed to evaluate the deep venous system(s) from the level of the common femoral vein through the popliteal and proximal calf veins. COMPARISON:  None. FINDINGS: VENOUS Normal compressibility of the common femoral, superficial femoral, and popliteal veins, as well as the visualized calf veins. Visualized portions of profunda femoral vein and great saphenous vein unremarkable. No filling defects to suggest DVT on grayscale or color Doppler imaging. Doppler waveforms show normal direction of venous flow, normal respiratory plasticity and response to augmentation. OTHER None. Limitations: none IMPRESSION: Negative. Electronically Signed   By: Jasmine Pang M.D.   On: 10/31/2020 18:26   DG Chest Portable 1 View  Result Date: 10/31/2020 CLINICAL DATA:  Anemia, elevated liver function tests EXAM: PORTABLE CHEST 1 VIEW COMPARISON:  11/24/2012 FINDINGS: The heart size and mediastinal contours are within normal limits. Both lungs are clear. The visualized skeletal structures are unremarkable. IMPRESSION: No active disease. Electronically Signed   By: Sharlet Salina M.D.   On: 10/31/2020 19:31    Impression: Abnormal LFTs: Suspect alcoholic hepatitis.  Hepatic discriminant function 58.9 as of 10/31/2020 (with PT reference of 13).    Suspect underlying cirrhosis as well.  Ultrasound showed coarsened hepatic echogenicity.  Thrombocytopenia (platelets 99K/uL).  PT/INR elevated to 23.5/2.1 as of 10/31/2020. MELD Na score of 26 as of 10/31/20.  Dilated CBD to 64mm per [place] 10/31/20.  Distended gallbladder containing layering sludge. No sonographic findings of acute cholecystitis.  Acute on chronic anemia: Hgb 7.1 without overt GI bleeding.  Decreased from 9.1 one year ago.  Plan: Start prednisolone 40 mg once daily.    2g sodium restricted diet.  Continue to trend LFTs.  Consider MRI/MRCP pending clinical course.  Patient will need an EGD for variceal screening as an outpatient (or  inpatient if anemia worsens or patient experiences frank GI bleeding); would recommend colonoscopy at the same time.  Protonix 40 mg IV BID.  Continue to monitor H&H with transfusion as needed to maintain Hgb >7.  [facility] will follow.   LOS: 1 day   Edrick Kins  PA-C 11/01/2020, 12:58 PM  Contact #  [phone]

## 2020-11-01 NOTE — Progress Notes (Signed)
PROGRESS NOTE    Seth Holmes  SJG:[id number] DOB: [birth year] DOA: 10/31/2020 PCP: [facility]   Chief Complaint  Patient presents with  . abnormal labs    Brief Narrative:  Patient 45 year old gentleman history of heavy alcohol use presented from med [facility] for GI evaluation due to common bile duct dilatation, jaundice. Patient presented to the ED after he was seen at [facility] for several complaints and noted to have abnormal lab work with severe anemia. -Patient with no PCP.  Patient did complain of lower extremity edema worse than on the left with yellowing of his eyes.  Patient noted to drink 6 packs of beer on a daily basis and has been doing it for a long time. -Patient noted to be hemodynamically stable in the ED, lab work concerning for microcytic anemia with a hemoglobin of 7.2, platelet count of 98, AST of 100, ALT of 27, INR of 2.1, total bilirubin of 10.6, direct bilirubin of 3.6.  Abdominal ultrasound showed distended gallbladder containing sludge but negative for acute cholecystitis, dilated common bile duct of 9 mm, findings suggestive of chronic liver disease.  Lower extremity Dopplers done negative for DVT.  [facility] consulted who recommended ERCP versus MRCP and patient likely to be started on steroid if other causes ruled out.   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 -Last drink noted to be in the afternoon of admission 10/31/2020. -Patient on Ativan withdrawal  protocol. -Acute hepatitis panel negative. -HIV nonreactive. -Abdominal ultrasound done with concerns for gallbladder sludge but negative for acute cholecystitis. -Continue thiamine, folic acid. 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 -Patient to be started on a steroid course per GI recommending holding off on MRCP at this time.  2.  Acute on chronic macrocytic anemia -Hemoglobin currently at 7.1.  Hemoglobin was 9.1 (10/22/2019) -Patient with no overt bleeding. -Anemia panel consistent with anemia of chronic disease.  Folate at 7.4. -Vitamin B12 of 641 -PPI -Transfusion threshold < 7. -May benefit from EGD versus colonoscopy however will defer to GI. -GI following and appreciate input and recommendations.  3.  Alcohol abuse -Alcohol cessation stressed to patient. -On interview this morning patient with no significant withdrawal noted however will need to monitor closely. -Continue the Ativan withdrawal protocol. -If worsening withdrawal may need to start on the Librium detox protocol. -Monitor for now.  4.  Supratherapeutic INR -Likely secondary to chronic liver disease.  Follow.     DVT prophylaxis: SCDs Code Status: Full Family Communication: Updated patient.  No family at bedside. Disposition:   Status is: Inpatient    Dispo: The patient is from: Home              Anticipated d/c is to: Home              Patient currently being evaluated for macrocytic anemia, jaundice, not stable for discharge.   Difficult to place patient no  Consultants:   Gastroenterology pending  Procedures:   Chest x-ray 10/31/2020  Abdominal ultrasound 10/31/2020  Lower extremity Dopplers 10/31/2020  Antimicrobials:   None   Subjective: Patient denies any chest pain.  No shortness of breath.  No abdominal pain.  No  nausea.  No vomiting.  Denies any rectal bleeding, melena, hematemesis.  Objective: Vitals:   11/01/20 0710 11/01/20 1106 11/01/20 1410 11/01/20 2021  BP: 134/68 (!) 106/58 121/71 109/63  Pulse: 72 67 71 63  Resp: 20 16 18 18   Temp: 99 F (37.2 C) 98.8 F (37.1 C) 99 F (37.2 C) 98.6 F (37 C)  TempSrc: Oral Oral Oral Oral  SpO2: 99% 99% 100% 99%  Weight:      Height:        Intake/Output Summary (Last 24 hours) at 11/01/2020 2125 Last data filed at 11/01/2020 0500 Gross per 24 hour  Intake 149.85 ml  Output 400 ml  Net -250.15 ml   Filed Weights   10/31/20 1358  Weight: 74.8 kg    Examination:  General exam: Appears calm and comfortable.  Scleral icterus. Respiratory system: Clear to auscultation. Respiratory effort normal. Cardiovascular system: S1 & S2 heard, RRR. No JVD, murmurs, rubs, gallops or clicks. No pedal edema. Gastrointestinal system: Abdomen is nondistended, soft and nontender. No organomegaly or masses felt. Normal bowel sounds heard. Central nervous system: Alert and oriented. No focal neurological deficits. Extremities: Symmetric 5 x 5 power. Skin: No rashes, lesions or ulcers Psychiatry: Judgement and insight appear normal. Mood & affect appropriate.     Data Reviewed: I have personally reviewed following labs and imaging studies  CBC: Recent Labs  Lab 10/31/20 1446 11/01/20 0125  WBC 5.8 5.4  NEUTROABS 2.7  --   HGB 7.2* 7.1*  HCT 22.3* 22.3*  MCV 125.3* 126.7*  PLT 98* 99*    Basic Metabolic Panel: Recent Labs  Lab 10/31/20 1446 11/01/20 0125  NA 135 136  K 4.0 3.6  CL 98 101  CO2 22 25  GLUCOSE 107* 93  BUN <5* <5*  CREATININE 0.42* 0.49*  CALCIUM 8.6* 8.7*    GFR: Estimated Creatinine Clearance: 120.3 mL/min (A) (by C-G formula based on SCr of 0.49 mg/dL (L)).  Liver Function Tests: Recent Labs  Lab 10/31/20 1446 11/01/20 0125  AST 100* 87*  ALT 27 25  ALKPHOS 99 97  BILITOT 10.6* 11.5*  PROT 8.6* 8.6*   ALBUMIN 2.7* 2.8*    CBG: No results for input(s): GLUCAP in the last 168 hours.   Recent Results (from the past 240 hour(s))  Resp Panel by RT-PCR (Flu A&B, Covid) Nasopharyngeal Swab     Status: None   Collection Time: 10/31/20  7:13 PM   Specimen: Nasopharyngeal Swab; Nasopharyngeal(NP) swabs in vial transport medium  Result Value Ref Range Status   SARS Coronavirus 2 by RT PCR NEGATIVE NEGATIVE Final    Comment: (NOTE) SARS-CoV-2 target nucleic acids are NOT DETECTED.  The SARS-CoV-2 RNA is generally detectable in upper respiratory specimens during the acute phase of infection. The lowest concentration of SARS-CoV-2 viral copies this assay can detect is 138 copies/mL. A negative result does not preclude SARS-Cov-2 infection and should not be used as the sole basis for treatment or other patient management decisions. A negative result may occur with  improper specimen collection/handling, submission of specimen other than nasopharyngeal swab, presence of viral mutation(s) within the areas targeted by this assay, and inadequate number of viral copies(<138 copies/mL). A negative result must be  combined with clinical observations, patient history, and epidemiological information. The expected result is Negative.  Fact Sheet for Patients:  BloggerCourse.com  Fact Sheet for Healthcare Providers:  SeriousBroker.it  This test is no t yet approved or cleared by the [place] FDA and  has been authorized for detection and/or diagnosis of SARS-CoV-2 by FDA under an Emergency Use Authorization (EUA). This EUA will remain  in effect (meaning this test can be used) for the duration of the COVID-19 declaration under Section 564(b)(1) of the Act, 21 U.S.C.section 360bbb-3(b)(1), unless the authorization is terminated  or revoked sooner.       Influenza A by PCR NEGATIVE NEGATIVE Final   Influenza B by PCR NEGATIVE NEGATIVE Final     Comment: (NOTE) The Xpert Xpress SARS-CoV-2/FLU/RSV plus assay is intended as an aid in the diagnosis of influenza from Nasopharyngeal swab specimens and should not be used as a sole basis for treatment. Nasal washings and aspirates are unacceptable for Xpert Xpress SARS-CoV-2/FLU/RSV testing.  Fact Sheet for Patients: BloggerCourse.com  Fact Sheet for Healthcare Providers: SeriousBroker.it  This test is not yet approved or cleared by the [place] FDA and has been authorized for detection and/or diagnosis of SARS-CoV-2 by FDA under an Emergency Use Authorization (EUA). This EUA will remain in effect (meaning this test can be used) for the duration of the COVID-19 declaration under Section 564(b)(1) of the Act, 21 U.S.C. section 360bbb-3(b)(1), unless the authorization is terminated or revoked.  Performed at [facility], [address] [place], [place] [address]   Blood culture (routine x 2)     Status: None (Preliminary result)   Collection Time: 10/31/20  8:13 PM   Specimen: BLOOD LEFT ARM  Result Value Ref Range Status   Specimen Description   Final    BLOOD LEFT ARM Performed at [facility] [address] [place], [place] [address]    Special Requests   Final    BOTTLES DRAWN AEROBIC AND ANAEROBIC Blood Culture adequate volume Performed at [facility], [address] [place], [place] [address]    Culture   Final    NO GROWTH < 12 HOURS Performed at [facility], [address] [place], [place] [address]    Report Status PENDING  Incomplete  Blood culture (routine x 2)     Status: None (Preliminary result)   Collection Time: 10/31/20  8:13 PM   Specimen: BLOOD RIGHT ARM  Result Value Ref Range Status   Specimen Description   Final    BLOOD RIGHT ARM Performed at [facility], [address] [place], [place] [address]    Special Requests   Final     BOTTLES DRAWN AEROBIC AND ANAEROBIC Blood Culture adequate volume Performed at [facility], [address] [place], [place] [address]    Culture   Final    NO GROWTH < 12 HOURS Performed at [facility], [address] [place], [place] [address]    Report Status PENDING  Incomplete         Radiology Studies: US Abdomen Limited  Result Date: 10/31/2020 CLINICAL DATA:  Transaminitis with elevated bilirubin.  Alcoholic. EXAM: ULTRASOUND ABDOMEN LIMITED RIGHT UPPER QUADRANT COMPARISON:  Abdominal ultrasound 10/22/2019 FINDINGS: Gallbladder: Distended containing layering sludge. No gallstones or wall thickening visualized. No sonographic Murphy sign noted by sonographer. Common bile duct: Diameter: 8-9 mm. Liver: Heterogeneous and coarsened parenchymal echogenicity.  Borderline increased compared to right kidney. No definite capsular nodularity. Previous intrahepatic biliary ductal dilatation is not as well appreciated currently. Portal vein is patent on color Doppler imaging with normal direction of blood flow towards the liver. Other: No right upper quadrant ascites. IMPRESSION: 1. Distended gallbladder containing layering sludge. No sonographic findings of acute cholecystitis. 2. Dilated common bile duct at 9 mm, cause not delineated. Previous intrahepatic biliary ductal dilatation is less well appreciated on the current exam. 3. Coarsened hepatic echogenicity with borderline increased compared to right kidney, suggestive of chronic liver disease. Electronically Signed   By: Narda Rutherford M.D.   On: 10/31/2020 18:28   US Venous Img Lower  Left (DVT Study)  Result Date: 10/31/2020 CLINICAL DATA:  Left leg swelling EXAM: Bilateral LOWER EXTREMITY VENOUS DOPPLER ULTRASOUND TECHNIQUE: Gray-scale sonography with compression, as well as color and duplex ultrasound, were performed to evaluate the deep venous system(s) from the level of the common femoral vein through the popliteal  and proximal calf veins. COMPARISON:  None. FINDINGS: VENOUS Normal compressibility of the common femoral, superficial femoral, and popliteal veins, as well as the visualized calf veins. Visualized portions of profunda femoral vein and great saphenous vein unremarkable. No filling defects to suggest DVT on grayscale or color Doppler imaging. Doppler waveforms show normal direction of venous flow, normal respiratory plasticity and response to augmentation. OTHER None. Limitations: none IMPRESSION: Negative. Electronically Signed   By: Jasmine Pang M.D.   On: 10/31/2020 18:26   DG Chest Portable 1 View  Result Date: 10/31/2020 CLINICAL DATA:  Anemia, elevated liver function tests EXAM: PORTABLE CHEST 1 VIEW COMPARISON:  11/24/2012 FINDINGS: The heart size and mediastinal contours are within normal limits. Both lungs are clear. The visualized skeletal structures are unremarkable. IMPRESSION: No active disease. Electronically Signed   By: Sharlet Salina M.D.   On: 10/31/2020 19:31        Scheduled Meds: . [START ON 11/02/2020] folic acid  1 mg Oral Daily  . pantoprazole (PROTONIX) IV  40 mg Intravenous Q12H  . prednisoLONE  40 mg Oral Daily  . [START ON 11/02/2020] thiamine  100 mg Oral Daily   Continuous Infusions:   LOS: 1 day    Time spent: 35 minutes  No charge.    Ramiro Harvest, MD [facility]   To contact the attending provider between 7A-7P or the covering provider during after hours 7P-7A, please log into the web site [email or web address] and access using universal [place] password for that web site. If you do not have the password, please call the hospital operator.  11/01/2020, 9:25 PM

## 2020-11-01 NOTE — H&P (Signed)
History and Physical    Seth HatchetJhountel Trinkle ZOX:[id number] DOB: 04-Nov-1975 DOA: 10/31/2020  [facility]  [facility]  I have personally briefly reviewed patient's old medical records in [facility]  Chief Complaint: anemia   HPI: Seth Holmes is a 45 y.o. male with medical history significant for heavy alcohol use who [facility] consult due to common bile duct dilatation.  Patient presented to the ED after [facility] was [facility] for several complaints and had lab work showing severe anemia.  Patient normally does not see a primary care physician. Patient reports that for months he has been having lower extremity edema worse on the left.  Also has noted yellowing of his eyes. He denies any abdominal pain, nausea vomiting or diarrhea.  No chest pain or shortness of breath. He reports drinking about 6 packs of beer daily.  States he has been doing it for a long time.  In the ED, he was hemodynamically stable.  Lab work notable for macrocytic anemia with hemoglobin of 7.2, platelet of 98, AST of 100 and ALT of 27.  INR of 2.1.  Total bilirubin 10.6 which is increased from 6.4 a year ago. Direct bilirubin of 3.6.  Abdominal ultrasound shows distended gallbladder containing sludge but no findings of acute cholecystitis.  Dilated common bile duct at 9 mm because not delineated.  Findings suggestive of chronic liver disease.  Left lower extremity venous Doppler negative for DVT  ED physician discussed case with Dr.Karki of [facility] who recommended ERCP versus MRCP in the morning.  Also will likely start steroid in the morning months other infections ruled out.  Review of Systems: Constitutional: No Weight Change, No Fever ENT/Mouth: No sore throat, No Rhinorrhea Eyes: No Eye Pain, No Vision Changes Cardiovascular: No Chest Pain, no SOB, Respiratory: No Cough, No Sputum Gastrointestinal: No Nausea, No Vomiting, No Diarrhea, No  Constipation, No Pain Genitourinary: no Urinary Incontinence,  Musculoskeletal: No Arthralgias, No Myalgias Skin: No Skin Lesions, No Pruritus, Neuro: no Weakness, No Numbness Psych: No Anxiety/Panic, No Depression, no decrease appetite Heme/Lymph: No Bruising, No Bleeding   Past Medical History:  Diagnosis Date  . H. pylori infection     Past Surgical History:  Procedure Laterality Date  . APPENDECTOMY    . COLONOSCOPY W/ BIOPSIES AND POLYPECTOMY       reports that he has been smoking cigarettes. He has been smoking about 0.50 packs per day. He has never used smokeless tobacco. He reports current alcohol use. He reports that he does not use drugs. Social History  No Known Allergies  No family history on file.   Prior to Admission medications   Medication Sig Start Date End Date Taking? Authorizing Provider  folic acid (V-R FOLIC ACID) 400 MCG tablet Take 1 tablet (400 mcg total) by mouth daily. Patient not taking: Reported on 11/01/2020 09/04/19   Raeford RazorKohut, Stephen, MD  furosemide (LASIX) 20 MG tablet Take 1 tablet (20 mg total) by mouth 2 (two) times daily. Patient not taking: Reported on 11/01/2020 09/04/19   Raeford RazorKohut, Stephen, MD  HYDROcodone-acetaminophen (NORCO/VICODIN) 5-325 MG tablet Take 1 tablet by mouth every 6 (six) hours as needed for severe pain. Patient not taking: Reported on 11/01/2020 11/23/17   Mesner, Barbara CowerJason, MD  ibuprofen (ADVIL,MOTRIN) 800 MG tablet Take 1 tablet (800 mg total) by mouth 3 (three) times daily. Patient not taking: Reported on 11/01/2020 11/23/17   Mesner, Barbara CowerJason, MD  potassium chloride  SA (KLOR-CON) 20 MEQ tablet Take 1 tablet (20 mEq total) by mouth daily. Patient not taking: Reported on 11/01/2020 09/04/19   Raeford Razor, MD  vitamin B-12 (CYANOCOBALAMIN) 100 MCG tablet Take 1 tablet (100 mcg total) by mouth daily. Patient not taking: Reported on 11/01/2020 09/04/19   Raeford Razor, MD    Physical Exam: Vitals:   10/31/20 2000 10/31/20 2030 10/31/20  2205 10/31/20 2310  BP: 126/79 109/79 110/76 124/76  Pulse: 72 70 66 69  Resp: 11 11 15 16   Temp:    98.6 F (37 C)  TempSrc:    Oral  SpO2: 98% 98% 98% 100%  Weight:      Height:        Constitutional: NAD, calm, comfortable, middle age male laying at 48 degree incline in bed Vitals:   10/31/20 2000 10/31/20 2030 10/31/20 2205 10/31/20 2310  BP: 126/79 109/79 110/76 124/76  Pulse: 72 70 66 69  Resp: 11 11 15 16   Temp:    98.6 F (37 C)  TempSrc:    Oral  SpO2: 98% 98% 98% 100%  Weight:      Height:       Eyes: PERRL, bilateral scleral icterus ENMT: Mucous membranes are moist.  Neck: normal, supple Respiratory: clear to auscultation bilaterally, no wheezing, no crackles. Normal respiratory effort. No accessory muscle use.  Cardiovascular: Regular rate and rhythm, no murmurs / rubs / gallops.  Nonpitting left lower extremity pretibial edema.   Abdomen: abdomen distention with no tenderness, no masses palpated.  Bowel sounds positive.  Musculoskeletal: no clubbing / cyanosis. No joint deformity upper and lower extremities. Good ROM, no contractures. Normal muscle tone.  Skin: no rashes, lesions, ulcers. No induration Neurologic: CN 2-12 grossly intact. Sensation intact, DTR normal. Strength 5/5 in all 4.  Psychiatric: Normal judgment and insight. Alert and oriented x 3. Pt avoiding eye contact throughout entire evaluation     Labs on Admission: I have personally reviewed following labs and imaging studies  CBC: Recent Labs  Lab 10/31/20 1446  WBC 5.8  NEUTROABS 2.7  HGB 7.2*  HCT 22.3*  MCV 125.3*  PLT 98*   Basic Metabolic Panel: Recent Labs  Lab 10/31/20 1446  NA 135  K 4.0  CL 98  CO2 22  GLUCOSE 107*  BUN <5*  CREATININE 0.42*  CALCIUM 8.6*   GFR: Estimated Creatinine Clearance: 120.3 mL/min (A) (by C-G formula based on SCr of 0.42 mg/dL (L)). Liver Function Tests: Recent Labs  Lab 10/31/20 1446  AST 100*  ALT 27  ALKPHOS 99  BILITOT 10.6*   PROT 8.6*  ALBUMIN 2.7*   Recent Labs  Lab 10/31/20 1446  LIPASE 49   Recent Labs  Lab 10/31/20 1446  AMMONIA 61*   Coagulation Profile: Recent Labs  Lab 10/31/20 1446  INR 2.1*   Cardiac Enzymes: No results for input(s): CKTOTAL, CKMB, CKMBINDEX, TROPONINI in the last 168 hours. BNP (last 3 results) No results for input(s): PROBNP in the last 8760 hours. HbA1C: No results for input(s): HGBA1C in the last 72 hours. CBG: No results for input(s): GLUCAP in the last 168 hours. Lipid Profile: No results for input(s): CHOL, HDL, LDLCALC, TRIG, CHOLHDL, LDLDIRECT in the last 72 hours. Thyroid Function Tests: No results for input(s): TSH, T4TOTAL, FREET4, T3FREE, THYROIDAB in the last 72 hours. Anemia Panel: No results for input(s): VITAMINB12, FOLATE, FERRITIN, TIBC, IRON, RETICCTPCT in the last 72 hours. Urine analysis:    Component Value Date/Time  COLORURINE ORANGE (A) 10/22/2019 1516   APPEARANCEUR CLEAR 10/22/2019 1516   LABSPEC 1.015 10/22/2019 1516   PHURINE 7.5 10/22/2019 1516   GLUCOSEU 100 (A) 10/22/2019 1516   HGBUR NEGATIVE 10/22/2019 1516   BILIRUBINUR MODERATE (A) 10/22/2019 1516   KETONESUR NEGATIVE 10/22/2019 1516   PROTEINUR NEGATIVE 10/22/2019 1516   NITRITE NEGATIVE 10/22/2019 1516   LEUKOCYTESUR NEGATIVE 10/22/2019 1516    Radiological Exams on Admission: US Abdomen Limited  Result Date: 10/31/2020 CLINICAL DATA:  Transaminitis with elevated bilirubin.  Alcoholic. EXAM: ULTRASOUND ABDOMEN LIMITED RIGHT UPPER QUADRANT COMPARISON:  Abdominal ultrasound 10/22/2019 FINDINGS: Gallbladder: Distended containing layering sludge. No gallstones or wall thickening visualized. No sonographic Murphy sign noted by sonographer. Common bile duct: Diameter: 8-9 mm. Liver: Heterogeneous and coarsened parenchymal echogenicity. Borderline increased compared to right kidney. No definite capsular nodularity. Previous intrahepatic biliary ductal dilatation is not as well  appreciated currently. Portal vein is patent on color Doppler imaging with normal direction of blood flow towards the liver. Other: No right upper quadrant ascites. IMPRESSION: 1. Distended gallbladder containing layering sludge. No sonographic findings of acute cholecystitis. 2. Dilated common bile duct at 9 mm, cause not delineated. Previous intrahepatic biliary ductal dilatation is less well appreciated on the current exam. 3. Coarsened hepatic echogenicity with borderline increased compared to right kidney, suggestive of chronic liver disease. Electronically Signed   By: Narda Rutherford M.D.   On: 10/31/2020 18:28   US Venous Img Lower  Left (DVT Study)  Result Date: 10/31/2020 CLINICAL DATA:  Left leg swelling EXAM: Bilateral LOWER EXTREMITY VENOUS DOPPLER ULTRASOUND TECHNIQUE: Gray-scale sonography with compression, as well as color and duplex ultrasound, were performed to evaluate the deep venous system(s) from the level of the common femoral vein through the popliteal and proximal calf veins. COMPARISON:  None. FINDINGS: VENOUS Normal compressibility of the common femoral, superficial femoral, and popliteal veins, as well as the visualized calf veins. Visualized portions of profunda femoral vein and great saphenous vein unremarkable. No filling defects to suggest DVT on grayscale or color Doppler imaging. Doppler waveforms show normal direction of venous flow, normal respiratory plasticity and response to augmentation. OTHER None. Limitations: none IMPRESSION: Negative. Electronically Signed   By: Jasmine Pang M.D.   On: 10/31/2020 18:26   DG Chest Portable 1 View  Result Date: 10/31/2020 CLINICAL DATA:  Anemia, elevated liver function tests EXAM: PORTABLE CHEST 1 VIEW COMPARISON:  11/24/2012 FINDINGS: The heart size and mediastinal contours are within normal limits. Both lungs are clear. The visualized skeletal structures are unremarkable. IMPRESSION: No active disease. Electronically Signed   By:  Sharlet Salina M.D.   On: 10/31/2020 19:31      Assessment/Plan  Alcoholic hepatic disease/Transaminitis Patient endorsed drinking 6 packs of beer daily for "some time" Last drink earlier in the afternoon of 4/12 start on CIWA protocol  daily thiamine and folic   Elevated T.bili/direct bili with CBD dilation  Total bilirubin 10.6 from a prior of 6.4-year ago shows likely progressive worsening of chronic kidney disease [facility] has been consulted by EDP and will see in consultation in the morning and would likely recommend ERCP versus MRCP in the morning.   Supratherapeutic INR  INR of 2.1 likely secondary to chronic liver disease  Macrocytic anemia likely anemia of chronic disease will check iron panel, vitamin B12 and folate repeat CBC in the morning with Hgb < 7 threshold for transfusion. Type and screen    DVT prophylaxis:.SCD due to supratherapeutic INR Code Status:  Full Family Communication: Plan discussed with patient at bedside  disposition Plan: Home with at least 2 midnight stays  Consults called: [facility] Admission status: inpatient  Level of care: Med-Surg  Status is: Inpatient  Remains inpatient appropriate because:Inpatient level of care appropriate due to severity of illness   Dispo: The patient is from: Home              Anticipated d/c is to: Home              Patient currently is not medically stable to d/c.   Difficult to place patient No         Anselm Jungling DO [facility]   If 7PM-7AM, please contact night-coverage [email or web address]   11/01/2020, 12:22 AM

## 2020-11-02 DIAGNOSIS — R17 Unspecified jaundice: Secondary | ICD-10-CM

## 2020-11-02 LAB — CBC WITH DIFFERENTIAL/PLATELET
Abs Immature Granulocytes: 0.04 10*3/uL (ref 0.00–0.07)
Basophils Absolute: 0 10*3/uL (ref 0.0–0.1)
Basophils Relative: 0 %
Eosinophils Absolute: 0.1 10*3/uL (ref 0.0–0.5)
Eosinophils Relative: 2 %
HCT: 22.3 % — ABNORMAL LOW (ref 39.0–52.0)
Hemoglobin: 7.1 g/dL — ABNORMAL LOW (ref 13.0–17.0)
Immature Granulocytes: 1 %
Lymphocytes Relative: 22 %
Lymphs Abs: 1.3 10*3/uL (ref 0.7–4.0)
MCH: 40.6 pg — ABNORMAL HIGH (ref 26.0–34.0)
MCHC: 31.8 g/dL (ref 30.0–36.0)
MCV: 127.4 fL — ABNORMAL HIGH (ref 80.0–100.0)
Monocytes Absolute: 0.6 10*3/uL (ref 0.1–1.0)
Monocytes Relative: 11 %
Neutro Abs: 3.7 10*3/uL (ref 1.7–7.7)
Neutrophils Relative %: 64 %
Platelets: 95 10*3/uL — ABNORMAL LOW (ref 150–400)
RBC: 1.75 MIL/uL — ABNORMAL LOW (ref 4.22–5.81)
RDW: 16.3 % — ABNORMAL HIGH (ref 11.5–15.5)
WBC: 5.8 10*3/uL (ref 4.0–10.5)
nRBC: 0.5 % — ABNORMAL HIGH (ref 0.0–0.2)

## 2020-11-02 LAB — URINE CULTURE: Culture: NO GROWTH

## 2020-11-02 LAB — HEPATIC FUNCTION PANEL
ALT: 23 U/L (ref 0–44)
AST: 60 U/L — ABNORMAL HIGH (ref 15–41)
Albumin: 2.6 g/dL — ABNORMAL LOW (ref 3.5–5.0)
Alkaline Phosphatase: 79 U/L (ref 38–126)
Bilirubin, Direct: 4.2 mg/dL — ABNORMAL HIGH (ref 0.0–0.2)
Indirect Bilirubin: 10.4 mg/dL — ABNORMAL HIGH (ref 0.3–0.9)
Total Bilirubin: 14.6 mg/dL — ABNORMAL HIGH (ref 0.3–1.2)
Total Protein: 8.2 g/dL — ABNORMAL HIGH (ref 6.5–8.1)

## 2020-11-02 LAB — BASIC METABOLIC PANEL
Anion gap: 8 (ref 5–15)
BUN: 10 mg/dL (ref 6–20)
CO2: 25 mmol/L (ref 22–32)
Calcium: 9.2 mg/dL (ref 8.9–10.3)
Chloride: 101 mmol/L (ref 98–111)
Creatinine, Ser: 0.46 mg/dL — ABNORMAL LOW (ref 0.61–1.24)
GFR, Estimated: >60 mL/min (ref >60)
Glucose, Bld: 109 mg/dL — ABNORMAL HIGH (ref 70–99)
Potassium: 3.7 mmol/L (ref 3.5–5.1)
Sodium: 134 mmol/L — ABNORMAL LOW (ref 135–145)

## 2020-11-02 LAB — PREPARE RBC (CROSSMATCH)

## 2020-11-02 LAB — PROTIME-INR
INR: 2.3 — ABNORMAL HIGH (ref 0.8–1.2)
Prothrombin Time: 25.3 seconds — ABNORMAL HIGH (ref 11.4–15.2)

## 2020-11-02 LAB — HEMOGLOBIN AND HEMATOCRIT, BLOOD
HCT: 26 % — ABNORMAL LOW (ref 39.0–52.0)
Hemoglobin: 8.4 g/dL — ABNORMAL LOW (ref 13.0–17.0)

## 2020-11-02 MED ORDER — SODIUM CHLORIDE 0.9% IV SOLUTION
Freq: Once | INTRAVENOUS | Status: AC
  Administered 2020-11-02: 250 mL via INTRAVENOUS

## 2020-11-02 MED ORDER — ACETAMINOPHEN 325 MG PO TABS
650.0000 mg | ORAL_TABLET | Freq: Once | ORAL | Status: AC
  Administered 2020-11-02: 650 mg via ORAL
  Filled 2020-11-02: qty 2

## 2020-11-02 MED ORDER — PANTOPRAZOLE SODIUM 40 MG PO TBEC
40.0000 mg | DELAYED_RELEASE_TABLET | Freq: Two times a day (BID) | ORAL | Status: DC
  Administered 2020-11-02 – 2020-11-03 (×3): 40 mg via ORAL
  Filled 2020-11-02 (×3): qty 1

## 2020-11-02 MED ORDER — DIPHENHYDRAMINE HCL 25 MG PO CAPS
25.0000 mg | ORAL_CAPSULE | Freq: Once | ORAL | Status: AC
  Administered 2020-11-02: 25 mg via ORAL
  Filled 2020-11-02: qty 1

## 2020-11-02 NOTE — Progress Notes (Signed)
[facility] Progress Note  Seth Holmes 45 y.o. [birth year]  CC:  Abnormal LFTs  Subjective: Patient denies nausea, vomiting, abdominal pain.  Has not had a BM.  Denies any rectal bleeding.  ROS : Review of Systems  Cardiovascular: Negative for chest pain and palpitations.  Gastrointestinal: Positive for constipation. Negative for abdominal pain, blood in stool, diarrhea, heartburn, melena, nausea and vomiting.   Objective: Vital signs in last 24 hours: Vitals:   11/02/20 1012 11/02/20 1048  BP: (!) 98/58 118/69  Pulse: 64 66  Resp: 20 16  Temp: 99 F (37.2 C) 99.2 F (37.3 C)  SpO2: 100% 100%    Physical Exam:  General:  Lethargic, cooperative, no distress  Head:  Normocephalic, without obvious abnormality, atraumatic  Eyes:  Scleral icterus, EOMs intact  Lungs:   Respirations unlabored  Heart:  Regular rate and rhythm  Abdomen:   Soft, non-tender, non-distended  Extremities: Extremities normal, atraumatic, no  edema  Pulses: 2+ and symmetric    Lab Results: Recent Labs    11/01/20 0125 11/02/20 0521  NA 136 134*  K 3.6 3.7  CL 101 101  CO2 25 25  GLUCOSE 93 109*  BUN <5* 10  CREATININE 0.49* 0.46*  CALCIUM 8.7* 9.2   Recent Labs    11/01/20 0125 11/02/20 0521  AST 87* 60*  ALT 25 23  ALKPHOS 97 79  BILITOT 11.5* 14.6*  PROT 8.6* 8.2*  ALBUMIN 2.8* 2.6*   Recent Labs    10/31/20 1446 11/01/20 0125 11/02/20 0521  WBC 5.8 5.4 5.8  NEUTROABS 2.7  --  3.7  HGB 7.2* 7.1* 7.1*  HCT 22.3* 22.3* 22.3*  MCV 125.3* 126.7* 127.4*  PLT 98* 99* 95*   Recent Labs    10/31/20 1446 11/02/20 0521  LABPROT 23.5* 25.3*  INR 2.1* 2.3*      Assessment: Abnormal LFTs: Suspect alcoholic hepatitis.  Hepatic discriminant function 58.9 as of 10/31/2020 (with PT reference of 13).    Suspect underlying cirrhosis as well.  Ultrasound showed coarsened hepatic echogenicity.  Thrombocytopenia (platelets 95K/uL).  PT/INR elevated to 25.3/2.3. MELD Na  score of 26 as of 10/31/20.  Dilated CBD to 25mm per [place] 10/31/20.  Distended gallbladder containing layering sludge. No sonographic findings of acute cholecystitis.  Acute on chronic anemia: Hgb 7.1 without overt GI bleeding.  Decreased from 9.1 one year ago.  Plan: Continue prednisolone 40 mg once daily.    2g sodium restricted diet.  Continue to trend LFTs.  Patient will need an EGD for variceal screening as an outpatient (or inpatient if anemia worsens or patient experiences frank GI bleeding); would recommend colonoscopy at the same time.  Protonix 40 mg IV BID.  Continue to monitor H&H with transfusion as needed to maintain Hgb >7.  [facility] will follow.   Edrick Kins PA-C 11/02/2020, 11:44 AM  Contact #  [phone]

## 2020-11-02 NOTE — TOC Initial Note (Signed)
Transition of Care [facility]) - Initial/Assessment Note    Patient Details  Name: Seth Holmes MRN: [id number] Date of Birth: [birth year]  Transition [facility] Care [facility]) CM/SW Contact:    Ida Rogue, LCSW Phone Number: 11/02/2020, 10:08 AM  Clinical Narrative:   Patient seen in follow up to MD consult for substance abuse.  Found Seth Holmes to be open to discussion about alcohol use and his desire to stop.  States he was able to quit for three weeks on his own in the past; unable to identify what was helpful during that period of sobriety, but did admit that the cravings became overwhelming, and he relapsed.  I normalized the cravings, and helped him to understand that he will again experience them.  He expresses desire to stop, has not attended AA meetings in past but is open to receiving a list of resources in the [place] area.  Declined information about IOP, inpatient options.  Seth Holmes is employed in [employer], has been at the same company for three years, lives at home with wife and two teenagers.  Elder is graduating in June. List of meetings given.  TOC will continue to follow during the course of hospitalization.                  Expected Discharge Plan: Home/Self Care Barriers to Discharge: No Barriers Identified   Patient Goals and CMS Choice        Expected Discharge Plan and Services Expected Discharge Plan: Home/Self Care In-house Referral: Clinical Social Work     Living arrangements for the past 2 months: Single Family Home                                      Prior Living Arrangements/Services Living arrangements for the past 2 months: Single Family Home Lives with:: Spouse,Minor Children Patient language and need for interpreter reviewed:: Yes Do you feel safe going back to the place where you live?: Yes      Need for Family Participation in Patient Care: Yes (Comment) Care giver support system in place?: Yes (comment)   Criminal Activity/Legal  Involvement Pertinent to Current Situation/Hospitalization: No - Comment as needed  Activities of Daily Living Home Assistive Devices/Equipment: None ADL Screening (condition at time of admission) Patient's cognitive ability adequate to safely complete daily activities?: Yes Is the patient deaf or have difficulty hearing?: No Does the patient have difficulty seeing, even when wearing glasses/contacts?: No Does the patient have difficulty concentrating, remembering, or making decisions?: No Patient able to express need for assistance with ADLs?: No Does the patient have difficulty dressing or bathing?: No Independently performs ADLs?: Yes (appropriate for developmental age) Does the patient have difficulty walking or climbing stairs?: No Weakness of Legs: None Weakness of Arms/Hands: None  Permission Sought/Granted                  Emotional Assessment Appearance:: Appears stated age Attitude/Demeanor/Rapport: Engaged Affect (typically observed): Flat Orientation: : Oriented to Self,Oriented to Place,Oriented to  Time,Oriented to Situation Alcohol / Substance Use: Alcohol Use Psych Involvement: No (comment)  Admission diagnosis:  Hyperbilirubinemia [E80.6] Jaundice [R17] Common bile duct dilation [K83.8] Alcohol use disorder, severe, dependence (HCC) [F10.20] Dilation of common bile duct [K83.8] Patient Active Problem List   Diagnosis Date Noted  . 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 Supratherapeutic INR 11/01/2020  . Macrocytic anemia 11/01/2020  .  Alcohol use disorder, severe, dependence (HCC)   . Common bile duct dilation   . Transaminitis   . Hyperbilirubinemia 10/31/2020  . Dilation of common bile duct 10/31/2020   PCP:  [facility]:   [id number] - [place], [place] - [address] AT [facility] [address] [place] [address] Phone: [phone] Fax:  [phone]     Social Determinants of Health (SDOH) Interventions    Readmission Risk Interventions No flowsheet data found.

## 2020-11-02 NOTE — Progress Notes (Signed)
PHARMACIST - PHYSICIAN COMMUNICATION  DR:   Janee Morn  CONCERNING: IV to Oral Route Change Policy  RECOMMENDATION: This patient is receiving pantoprazole by the intravenous route.  Based on criteria approved by the Pharmacy and Therapeutics Committee, the intravenous medication(s) is/are being converted to the equivalent oral dose form(s).   DESCRIPTION: These criteria include:  The patient is eating (either orally or via tube) and/or has been taking other orally administered medications for a least 24 hours  The patient has no evidence of active gastrointestinal bleeding or impaired GI absorption (gastrectomy, short bowel, patient on TNA or NPO).  If you have questions about this conversion, please contact the Pharmacy Department   Selena Swaminathan P. Casimiro Needle, PharmD, BCPS Clinical Pharmacist [facility] Please utilize Amion for appropriate phone number to reach the unit pharmacist [facility]) 11/02/2020 11:32 AM

## 2020-11-02 NOTE — Progress Notes (Addendum)
PROGRESS NOTE    Seth Holmes  UJW:[id number] DOB: 1976-04-16 DOA: 10/31/2020 [facility]CP: [facility]   Chief Complaint  Patient presents with  . abnormal labs    Brief Narrative:  Patient 45 year old gentleman history of heavy alcohol use presented from [facility] for GI evaluation due to common bile duct dilatation, jaundice. Patient presented to the ED after he was seen at [facility] for several complaints and noted to have abnormal lab work with severe anemia. -Patient with no PCP.  Patient did complain of lower extremity edema worse than on the left with yellowing of his eyes.  Patient noted to drink 6 packs of beer on a daily basis and has been doing it for a long time. -Patient noted to be hemodynamically stable in the ED, lab work concerning for microcytic anemia with a hemoglobin of 7.2, platelet count of 98, AST of 100, ALT of 27, INR of 2.1, total bilirubin of 10.6, direct bilirubin of 3.6.  Abdominal ultrasound showed distended gallbladder containing sludge but negative for acute cholecystitis, dilated common bile duct of 9 mm, findings suggestive of chronic liver disease.  Lower extremity Dopplers done negative for DVT.  [facility] consulted who recommended ERCP versus MRCP and patient likely to be started on steroid if other causes ruled out.   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 -Last drink noted to be in the afternoon of admission 10/31/2020. -Continue Ativan withdrawal protocol.   -Acute hepatitis panel negative. -HIV nonreactive. -Abdominal ultrasound done with concerns for gallbladder sludge but negative for acute cholecystitis. -Continue thiamine, folic acid. 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 -Patient has been started on prednisone for alcoholic hepatitis per GI.   -Appreciate GI input and recommendations.  2.  Acute on chronic macrocytic anemia -Hemoglobin currently at 7.1.  Hemoglobin was 9.1 (10/22/2019) -Patient with no overt bleeding. -Anemia panel consistent with anemia of chronic disease.  Folate at 7.4. -Vitamin B12 of 641 -PPI -Patient with borderline blood pressure, will transfuse a unit of packed red blood cells. -May benefit from EGD versus colonoscopy however will defer to GI. -GI following and appreciate input and recommendations.  3.  Alcohol abuse -Alcohol cessation stressed to patient. -Patient with no noted significant withdrawal symptoms.  -Continue Ativan withdrawal protocol, thiamine, folic acid, multivitamin.  4.  Supratherapeutic INR -Likely secondary to chronic liver disease.  Patient with no overt bleeding.  Follow.     DVT prophylaxis: SCDs Code Status: Full Family Communication: Updated patient.  No family at bedside. Disposition:   Status is: Inpatient    Dispo: The patient is from: Home              Anticipated d/c is to: Home once cleared by GI hopefully in the next 24 to 48 hours.              Patient currently being evaluated for macrocytic anemia, jaundice, being transfused 1 unit packed  red blood cells, not stable for discharge.   Difficult to place patient no       Consultants:   Gastroenterology: Dr. Bosie Clos 11/01/2020  Procedures:   Chest x-ray 10/31/2020  Abdominal ultrasound 10/31/2020  Lower extremity Dopplers 10/31/2020  Transfusion 1  unit packed red blood cells 11/02/2020  Antimicrobials:   None   Subjective: Sitting up in bed receiving transfusion of packed red blood cells and eating lunch.  Denies any chest pain or shortness of breath.  No nausea or vomiting.  No abdominal pain.  No rectal bleeding, no hematemesis, no melena.   Objective: Vitals:   11/01/20 2021 11/02/20 0542 11/02/20 1012 11/02/20 1048  BP: 109/63 (!) 92/54 (!) 98/58 118/69  Pulse: 63 64 64 66  Resp: 18 19 20 16   Temp: 98.6 F (37 C) 99 F (37.2 C) 99 F (37.2 C) 99.2 F (37.3 C)  TempSrc: Oral  Oral Oral  SpO2: 99% 98% 100% 100%  Weight:      Height:        Intake/Output Summary (Last 24 hours) at 11/02/2020 1241 Last data filed at 11/02/2020 1033 Gross per 24 hour  Intake 300 ml  Output 200 ml  Net 100 ml   Filed Weights   10/31/20 1358  Weight: 74.8 kg    Examination:  General exam: : NAD.  Scleral icterus. Respiratory system: CTA B.  No wheezes, no rhonchi.  Speaking in full sentences.  Normal respiratory effort. Cardiovascular system: Regular rate and rhythm no murmurs rubs or gallops.  No JVD.  No lower extremity edema.  Gastrointestinal system: Abdomen soft, nontender, nondistended, positive bowel sounds.  No rebound.  No guarding. Central nervous system: Alert and oriented. No focal neurological deficits. Extremities: Symmetric 5 x 5 power. Skin: No rashes, lesions or ulcers Psychiatry: Judgement and insight appear normal. Mood & affect appropriate.   Data Reviewed: I have personally reviewed following labs and imaging studies  CBC: Recent Labs  Lab 10/31/20 1446 11/01/20 0125 11/02/20 0521  WBC 5.8 5.4 5.8  NEUTROABS 2.7  --  3.7  HGB 7.2* 7.1* 7.1*  HCT 22.3* 22.3* 22.3*  MCV 125.3* 126.7* 127.4*  PLT 98* 99* 95*    Basic Metabolic Panel: Recent Labs  Lab 10/31/20 1446 11/01/20 0125 11/02/20 0521  NA 135 136 134*  K 4.0 3.6 3.7  CL 98 101 101  CO2 22 25 25   GLUCOSE 107* 93 109*  BUN <5* <5*  10  CREATININE 0.42* 0.49* 0.46*  CALCIUM 8.6* 8.7* 9.2    GFR: Estimated Creatinine Clearance: 120.3 mL/min (A) (by C-G formula based on SCr of 0.46 mg/dL (L)).  Liver Function Tests: Recent Labs  Lab 10/31/20 1446 11/01/20 0125 11/02/20 0521  AST 100* 87* 60*  ALT 27 25 23   ALKPHOS 99 97 79  BILITOT 10.6* 11.5* 14.6*  PROT 8.6* 8.6* 8.2*  ALBUMIN 2.7* 2.8* 2.6*    CBG: No results for input(s): GLUCAP in the last 168 hours.   Recent Results (from the past 240 hour(s))  Resp Panel by RT-PCR (Flu A&B, Covid) Nasopharyngeal Swab     Status: None   Collection Time: 10/31/20  7:13 PM   Specimen: Nasopharyngeal Swab; Nasopharyngeal(NP) swabs in vial transport medium  Result Value Ref Range Status   SARS Coronavirus 2 by RT PCR NEGATIVE NEGATIVE Final    Comment: (NOTE) SARS-CoV-2 target nucleic acids are NOT DETECTED.  The SARS-CoV-2 RNA is generally detectable in upper respiratory specimens during the acute phase of  infection. The lowest concentration of SARS-CoV-2 viral copies this assay can detect is 138 copies/mL. A negative result does not preclude SARS-Cov-2 infection and should not be used as the sole basis for treatment or other patient management decisions. A negative result may occur with  improper specimen collection/handling, submission of specimen other than nasopharyngeal swab, presence of viral mutation(s) within the areas targeted by this assay, and inadequate number of viral copies(<138 copies/mL). A negative result must be combined with clinical observations, patient history, and epidemiological information. The expected result is Negative.  Fact Sheet for Patients:  BloggerCourse.com  Fact Sheet for Healthcare Providers:  SeriousBroker.it  This test is no t yet approved or cleared by the [place] FDA and  has been authorized for detection and/or diagnosis of SARS-CoV-2 by FDA under an Emergency  Use Authorization (EUA). This EUA will remain  in effect (meaning this test can be used) for the duration of the COVID-19 declaration under Section 564(b)(1) of the Act, 21 U.S.C.section 360bbb-3(b)(1), unless the authorization is terminated  or revoked sooner.       Influenza A by PCR NEGATIVE NEGATIVE Final   Influenza B by PCR NEGATIVE NEGATIVE Final    Comment: (NOTE) The Xpert Xpress SARS-CoV-2/FLU/RSV plus assay is intended as an aid in the diagnosis of influenza from Nasopharyngeal swab specimens and should not be used as a sole basis for treatment. Nasal washings and aspirates are unacceptable for Xpert Xpress SARS-CoV-2/FLU/RSV testing.  Fact Sheet for Patients: BloggerCourse.com  Fact Sheet for Healthcare Providers: SeriousBroker.it  This test is not yet approved or cleared by the [place] FDA and has been authorized for detection and/or diagnosis of SARS-CoV-2 by FDA under an Emergency Use Authorization (EUA). This EUA will remain in effect (meaning this test can be used) for the duration of the COVID-19 declaration under Section 564(b)(1) of the Act, 21 U.S.C. section 360bbb-3(b)(1), unless the authorization is terminated or revoked.  Performed at [facility], [address] [place], [place] [address]   Urine culture     Status: None   Collection Time: 10/31/20  7:54 PM   Specimen: Urine, Random  Result Value Ref Range Status   Specimen Description   Final    URINE, RANDOM Performed at [facility], [address] [place], [place] [address]    Special Requests   Final    NONE Performed at [facility], [address] [place], [place] [address]    Culture   Final    NO GROWTH Performed at [facility], [address], [place], [place] [address]    Report Status 11/02/2020 FINAL  Final  Blood culture (routine x 2)     Status: None (Preliminary result)    Collection Time: 10/31/20  8:13 PM   Specimen: BLOOD LEFT ARM  Result Value Ref Range Status   Specimen Description   Final    BLOOD LEFT ARM Performed at [facility], [address] [place], [place] [address]    Special Requests   Final    BOTTLES DRAWN AEROBIC AND ANAEROBIC Blood Culture adequate volume Performed at [facility], [address] [place], [place] [address]    Culture   Final    NO GROWTH 2 DAYS Performed at [facility], [address] [place], [place] [address]    Report Status PENDING  Incomplete  Blood culture (routine x 2)  Status: None (Preliminary result)   Collection Time: 10/31/20  8:13 PM   Specimen: BLOOD RIGHT ARM  Result Value Ref Range Status   Specimen Description   Final    BLOOD RIGHT ARM Performed at [facility], [address] [place], [place] [address]    Special Requests   Final    BOTTLES DRAWN AEROBIC AND ANAEROBIC Blood Culture adequate volume Performed at [facility], [address] [place], [place] [address]    Culture   Final    NO GROWTH 2 DAYS Performed at [facility], [address] [place], [place] [address]    Report Status PENDING  Incomplete         Radiology Studies: US Abdomen Limited  Result Date: 10/31/2020 CLINICAL DATA:  Transaminitis with elevated bilirubin.  Alcoholic. EXAM: ULTRASOUND ABDOMEN LIMITED RIGHT UPPER QUADRANT COMPARISON:  Abdominal ultrasound 10/22/2019 FINDINGS: Gallbladder: Distended containing layering sludge. No gallstones or wall thickening visualized. No sonographic Murphy sign noted by sonographer. Common bile duct: Diameter: 8-9 mm. Liver: Heterogeneous and coarsened parenchymal echogenicity. Borderline increased compared to right kidney. No definite capsular nodularity. Previous intrahepatic biliary ductal dilatation is not as well appreciated currently. Portal vein is patent on color Doppler imaging with normal direction of  blood flow towards the liver. Other: No right upper quadrant ascites. IMPRESSION: 1. Distended gallbladder containing layering sludge. No sonographic findings of acute cholecystitis. 2. Dilated common bile duct at 9 mm, cause not delineated. Previous intrahepatic biliary ductal dilatation is less well appreciated on the current exam. 3. Coarsened hepatic echogenicity with borderline increased compared to right kidney, suggestive of chronic liver disease. Electronically Signed   By: Narda RutherfordMelanie  Sanford M.D.   On: 10/31/2020 18:28   US Venous Img Lower  Left (DVT Study)  Result Date: 10/31/2020 CLINICAL DATA:  Left leg swelling EXAM: Bilateral LOWER EXTREMITY VENOUS DOPPLER ULTRASOUND TECHNIQUE: Gray-scale sonography with compression, as well as color and duplex ultrasound, were performed to evaluate the deep venous system(s) from the level of the common femoral vein through the popliteal and proximal calf veins. COMPARISON:  None. FINDINGS: VENOUS Normal compressibility of the common femoral, superficial femoral, and popliteal veins, as well as the visualized calf veins. Visualized portions of profunda femoral vein and great saphenous vein unremarkable. No filling defects to suggest DVT on grayscale or color Doppler imaging. Doppler waveforms show normal direction of venous flow, normal respiratory plasticity and response to augmentation. OTHER None. Limitations: none IMPRESSION: Negative. Electronically Signed   By: Jasmine PangKim  Fujinaga M.D.   On: 10/31/2020 18:26   DG Chest Portable 1 View  Result Date: 10/31/2020 CLINICAL DATA:  Anemia, elevated liver function tests EXAM: PORTABLE CHEST 1 VIEW COMPARISON:  11/24/2012 FINDINGS: The heart size and mediastinal contours are within normal limits. Both lungs are clear. The visualized skeletal structures are unremarkable. IMPRESSION: No active disease. Electronically Signed   By: Sharlet SalinaMichael  Brown M.D.   On: 10/31/2020 19:31        Scheduled Meds: . folic acid  1 mg  Oral Daily  . pantoprazole  40 mg Oral BID  . prednisoLONE  40 mg Oral Daily  . thiamine  100 mg Oral Daily   Continuous Infusions:   LOS: 2 days    Time spent: 35 minutes    Ramiro Harvestaniel Ephraim Reichel, MD [facility]   To contact the attending provider between 7A-7P or the covering provider during after hours 7P-7A, please log into the web site [email or web address]  and access using universal [place] password for that web site. If you do not have the password, please call the hospital operator.  11/02/2020, 12:41 PM

## 2020-11-03 DIAGNOSIS — K7010 Alcoholic hepatitis without ascites: Principal | ICD-10-CM

## 2020-11-03 LAB — BASIC METABOLIC PANEL
Anion gap: 8 (ref 5–15)
BUN: 10 mg/dL (ref 6–20)
CO2: 24 mmol/L (ref 22–32)
Calcium: 9 mg/dL (ref 8.9–10.3)
Chloride: 102 mmol/L (ref 98–111)
Creatinine, Ser: 0.52 mg/dL — ABNORMAL LOW (ref 0.61–1.24)
GFR, Estimated: >60 mL/min (ref >60)
Glucose, Bld: 92 mg/dL (ref 70–99)
Potassium: 3.4 mmol/L — ABNORMAL LOW (ref 3.5–5.1)
Sodium: 134 mmol/L — ABNORMAL LOW (ref 135–145)

## 2020-11-03 LAB — TYPE AND SCREEN
ABO/RH(D): O POS
Antibody Screen: NEGATIVE
Unit division: 0

## 2020-11-03 LAB — HEPATIC FUNCTION PANEL
ALT: 24 U/L (ref 0–44)
AST: 54 U/L — ABNORMAL HIGH (ref 15–41)
Albumin: 2.6 g/dL — ABNORMAL LOW (ref 3.5–5.0)
Alkaline Phosphatase: 90 U/L (ref 38–126)
Bilirubin, Direct: 3.8 mg/dL — ABNORMAL HIGH (ref 0.0–0.2)
Indirect Bilirubin: 7.4 mg/dL — ABNORMAL HIGH (ref 0.3–0.9)
Total Bilirubin: 11.2 mg/dL — ABNORMAL HIGH (ref 0.3–1.2)
Total Protein: 8 g/dL (ref 6.5–8.1)

## 2020-11-03 LAB — CBC
HCT: 24.3 % — ABNORMAL LOW (ref 39.0–52.0)
Hemoglobin: 8.2 g/dL — ABNORMAL LOW (ref 13.0–17.0)
MCH: 39.4 pg — ABNORMAL HIGH (ref 26.0–34.0)
MCHC: 33.7 g/dL (ref 30.0–36.0)
MCV: 118.3 fL — ABNORMAL HIGH (ref 80.0–100.0)
Platelets: 94 10*3/uL — ABNORMAL LOW (ref 150–400)
RBC: 2.08 MIL/uL — ABNORMAL LOW (ref 4.22–5.81)
RDW: 21.6 % — ABNORMAL HIGH (ref 11.5–15.5)
WBC: 7.3 10*3/uL (ref 4.0–10.5)
nRBC: 0.4 % — ABNORMAL HIGH (ref 0.0–0.2)

## 2020-11-03 LAB — BPAM RBC
Blood Product Expiration Date: 202205122359
ISSUE DATE / TIME: 202204141024
Unit Type and Rh: 5100

## 2020-11-03 MED ORDER — PANTOPRAZOLE SODIUM 40 MG PO TBEC: 40.0000 mg | DELAYED_RELEASE_TABLET | Freq: Every day | ORAL | 1 refills | Status: AC

## 2020-11-03 MED ORDER — FOLIC ACID 1 MG PO TABS: 1.0000 mg | ORAL_TABLET | Freq: Every day | ORAL | Status: AC

## 2020-11-03 MED ORDER — POTASSIUM CHLORIDE CRYS ER 20 MEQ PO TBCR
40.0000 meq | EXTENDED_RELEASE_TABLET | Freq: Once | ORAL | Status: AC
  Administered 2020-11-03: 40 meq via ORAL
  Filled 2020-11-03: qty 2

## 2020-11-03 MED ORDER — THIAMINE HCL 100 MG PO TABS: 100.0000 mg | ORAL_TABLET | Freq: Every day | ORAL | Status: AC

## 2020-11-03 MED ORDER — PREDNISOLONE 5 MG PO TABS: 40.0000 mg | ORAL_TABLET | Freq: Every day | ORAL | 1 refills | Status: AC

## 2020-11-03 NOTE — Progress Notes (Signed)
Seth Holmes 11:47 AM  Subjective: Patient seen and examined and discussed with the hospital team as well as my partner Dr. Bosie Clos and currently he does not have any GI complaints and he says his appetite is improving and he is moving his bowels and he has no new complaints  Objective: Signs stable afebrile no acute distress abdomen is soft nontender labs all about the same  Assessment: Alcoholic hepatitis on prednisone  Plan: Okay with me to go home with close outpatient GI follow-up with Dr. Bosie Clos to monitor labs and consider outpatient GI work-up and might start with a CT scan at some point to rule out anything significant and better evaluate the liver spleen etc. and please call me if I can be of any further assistance with this hospital stay  [facility]  office [phone] After 5PM or if no answer call [phone]

## 2020-11-03 NOTE — Discharge Summary (Signed)
Physician Discharge Summary  Seth Holmes WLS:[id number] DOB: [birth year] DOA: 10/31/2020  PCP: [facility]  Admit date: 10/31/2020 Discharge date: 11/03/2020  Time spent: 55 minutes  Recommendations for Outpatient Follow-up:  1. Follow-up with Dr. Bosie Clos, gastroenterology in 1 to 2 weeks for follow-up on alcoholic hepatitis and anemia. 2. Follow-up with [facility] in 2 weeks.  On follow-up patient will need a comprehensive metabolic profile, magnesium done to follow-up on electrolytes and renal function.  Patient will need a CBC done to follow-up on H&H.   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 Foye Clock is a 45 y.o. male with medical history significant for heavy alcohol use who presents from [facility] for GI consult due to common bile duct dilatation.  Patient presented to the ED after he was seen at [facility] for several complaints and had lab work showing severe anemia.  Patient normally does not see a primary care physician. Patient reported that for months he had been having lower extremity edema worse on the left.  Also has noted yellowing of his eyes. He denied any abdominal pain, nausea vomiting or diarrhea.  No chest pain or shortness of breath. He reported drinking about 6 packs of beer daily.  Stated he had been doing it for a long time.  In the ED, he was hemodynamically stable.  Lab work notable for macrocytic anemia with hemoglobin of 7.2, platelet of 98, AST of 100 and ALT of  27.  INR of 2.1.  Total bilirubin 10.6 which is increased from 6.4 a year ago. Direct bilirubin of 3.6.  Abdominal ultrasound shows distended gallbladder containing sludge but no findings of acute cholecystitis.  Dilated common bile duct at 9 mm because not delineated.  Findings suggestive of chronic liver disease.  Left lower extremity venous Doppler negative for DVT  ED physician discussed case with Dr.Karki of [facility] who recommended ERCP versus MRCP in the morning.  Also will likely start steroid in the morning months other infections ruled out.   Hospital Course:  1 transaminitis/alcoholic liver disease/elevated total bilirubin/direct bilirubin with CBD dilation secondary to alcoholic hepatitis -Patient presented with a transaminitis, scleral icterus, ongoing alcohol use of 6 packs/day for a while. -Last drink noted to be in the afternoon of admission 10/31/2020. -Acute hepatitis panel negative. -HIV nonreactive. -Abdominal ultrasound done with concerns for gallbladder sludge but negative for acute cholecystitis. -Patient was maintained on Ativan withdrawal protocol as well as thiamine, folic acid, multivitamin.  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 -Patient has been started on prednisolone for alcoholic hepatitis per GI.   -Patient be discharged home on prednisolone 40 mg daily until outpatient follow-up with GI.   -Follow-up with GI, Dr. Bosie Clos in 1 week.   2.  Acute on chronic macrocytic anemia -Hemoglobin noted to be at 7.1 during the hospitalization on admission. Hemoglobin was 9.1 (10/22/2019) -Patient with no  overt bleeding. -Anemia panel consistent with anemia of chronic disease.  Folate at 7.4. -Vitamin B12 of 641 -Patient maintained on a PPI during the hospitalization. -Patient with borderline blood pressure, and  patient subsequently transfused a unit of packed red blood cells with hemoglobin going up appropriately to 8.2 by day of discharge.  -May benefit from EGD versus colonoscopy which likely will be done in the outpatient setting. -Patient was seen by GI and followed by GI during the hospitalization. -Close outpatient follow-up with GI.  3.  Alcohol abuse -Alcohol cessation stressed to patient. -Patient with no noted significant withdrawal symptoms during the hospitalization..  -Patient was maintained on the Ativan withdrawal protocol, thiamine, folic acid, multivitamin.   4.  Supratherapeutic INR -Likely secondary to chronic liver disease.  Patient with no overt bleeding.  Outpatient follow-up with GI and PCP.    Procedures:  Chest x-ray 10/31/2020  Abdominal ultrasound 10/31/2020  Lower extremity Dopplers 10/31/2020  Transfusion 1 unit packed red blood cells 11/02/2020   Consultations:  Gastroenterology: Dr. Bosie ClosSchooler 11/01/2020   Discharge Exam: Vitals:   11/02/20 2054 11/03/20 0554  BP: 117/74 113/73  Pulse: 83 64  Resp: 17   Temp: 98.6 F (37 C) 98.2 F (36.8 C)  SpO2: 98% 100%    General: NAD.  Scleral icterus.  Jaundice Cardiovascular: Regular rate rhythm no murmurs rubs or gallops Respiratory: Clear to auscultation bilaterally.  No wheezes, no crackles, no rhonchi.  Discharge Instructions   Discharge Instructions    Diet - low sodium heart healthy   Complete by: As directed    Discharge instructions   Complete by: As directed    No NSAID use.  No Aleve, no ibuprofen, no Goody's powder. -May take Tylenol but only up to 2 g/day. -Please stop drinking alcohol.   Increase activity slowly   Complete by: As directed      Allergies as of 11/03/2020   No Known Allergies     Medication List    STOP taking these medications   furosemide 20 MG tablet Commonly known as: LASIX   HYDROcodone-acetaminophen 5-325 MG tablet Commonly known as: NORCO/VICODIN    ibuprofen 800 MG tablet Commonly known as: ADVIL   potassium chloride SA 20 MEQ tablet Commonly known as: KLOR-CON   vitamin B-12 100 MCG tablet Commonly known as: CYANOCOBALAMIN     TAKE these medications   folic acid 1 MG tablet Commonly known as: FOLVITE Take 1 tablet (1 mg total) by mouth daily. Start taking on: November 04, 2020 What changed:   medication strength  how much to take   pantoprazole 40 MG tablet Commonly known as: PROTONIX Take 1 tablet (40 mg total) by mouth daily.   prednisoLONE 5 MG Tabs tablet Take 8 tablets (40 mg total) by mouth daily. Start taking on: November 04, 2020   thiamine 100 MG tablet Take 1 tablet (100 mg total) by mouth daily. Start taking on: November 04, 2020      No Known Allergies  Follow-up Information    Charlott RakesSchooler, Vincent, MD Follow up in 1 week(s).   Specialty: Gastroenterology Contact information: [address] [place] [address] [phone]        [facility]. Schedule an appointment as soon as possible for a visit in 2 week(s).   Contact information: [address] [place] [id number] [phone]                The results of significant diagnostics from this  hospitalization (including imaging, microbiology, ancillary and laboratory) are listed below for reference.    Significant Diagnostic Studies: US Abdomen Limited  Result Date: 10/31/2020 CLINICAL DATA:  Transaminitis with elevated bilirubin.  Alcoholic. EXAM: ULTRASOUND ABDOMEN LIMITED RIGHT UPPER QUADRANT COMPARISON:  Abdominal ultrasound 10/22/2019 FINDINGS: Gallbladder: Distended containing layering sludge. No gallstones or wall thickening visualized. No sonographic Murphy sign noted by sonographer. Common bile duct: Diameter: 8-9 mm. Liver: Heterogeneous and coarsened parenchymal echogenicity. Borderline increased compared to right kidney. No definite capsular nodularity. Previous intrahepatic biliary ductal  dilatation is not as well appreciated currently. Portal vein is patent on color Doppler imaging with normal direction of blood flow towards the liver. Other: No right upper quadrant ascites. IMPRESSION: 1. Distended gallbladder containing layering sludge. No sonographic findings of acute cholecystitis. 2. Dilated common bile duct at 9 mm, cause not delineated. Previous intrahepatic biliary ductal dilatation is less well appreciated on the current exam. 3. Coarsened hepatic echogenicity with borderline increased compared to right kidney, suggestive of chronic liver disease. Electronically Signed   By: Narda Rutherford M.D.   On: 10/31/2020 18:28   US Venous Img Lower  Left (DVT Study)  Result Date: 10/31/2020 CLINICAL DATA:  Left leg swelling EXAM: Bilateral LOWER EXTREMITY VENOUS DOPPLER ULTRASOUND TECHNIQUE: Gray-scale sonography with compression, as well as color and duplex ultrasound, were performed to evaluate the deep venous system(s) from the level of the common femoral vein through the popliteal and proximal calf veins. COMPARISON:  None. FINDINGS: VENOUS Normal compressibility of the common femoral, superficial femoral, and popliteal veins, as well as the visualized calf veins. Visualized portions of profunda femoral vein and great saphenous vein unremarkable. No filling defects to suggest DVT on grayscale or color Doppler imaging. Doppler waveforms show normal direction of venous flow, normal respiratory plasticity and response to augmentation. OTHER None. Limitations: none IMPRESSION: Negative. Electronically Signed   By: Jasmine Pang M.D.   On: 10/31/2020 18:26   DG Chest Portable 1 View  Result Date: 10/31/2020 CLINICAL DATA:  Anemia, elevated liver function tests EXAM: PORTABLE CHEST 1 VIEW COMPARISON:  11/24/2012 FINDINGS: The heart size and mediastinal contours are within normal limits. Both lungs are clear. The visualized skeletal structures are unremarkable. IMPRESSION: No active disease.  Electronically Signed   By: Sharlet Salina M.D.   On: 10/31/2020 19:31    Microbiology: Recent Results (from the past 240 hour(s))  Resp Panel by RT-PCR (Flu A&B, Covid) Nasopharyngeal Swab     Status: None   Collection Time: 10/31/20  7:13 PM   Specimen: Nasopharyngeal Swab; Nasopharyngeal(NP) swabs in vial transport medium  Result Value Ref Range Status   SARS Coronavirus 2 by RT PCR NEGATIVE NEGATIVE Final    Comment: (NOTE) SARS-CoV-2 target nucleic acids are NOT DETECTED.  The SARS-CoV-2 RNA is generally detectable in upper respiratory specimens during the acute phase of infection. The lowest concentration of SARS-CoV-2 viral copies this assay can detect is 138 copies/mL. A negative result does not preclude SARS-Cov-2 infection and should not be used as the sole basis for treatment or other patient management decisions. A negative result may occur with  improper specimen collection/handling, submission of specimen other than nasopharyngeal swab, presence of viral mutation(s) within the areas targeted by this assay, and inadequate number of viral copies(<138 copies/mL). A negative result must be combined with clinical observations, patient history, and epidemiological information. The expected result is Negative.  Fact Sheet for Patients:  BloggerCourse.com  Fact Sheet for Healthcare Providers:  SeriousBroker.it  This  test is no t yet approved or cleared by the [place] and  has been authorized for detection and/or diagnosis of SARS-CoV-2 by FDA under an Emergency Use Authorization (EUA). This EUA will remain  in effect (meaning this test can be used) for the duration of the COVID-19 declaration under Section 564(b)(1) of the Act, 21 U.S.C.section 360bbb-3(b)(1), unless the authorization is terminated  or revoked sooner.       Influenza A by PCR NEGATIVE NEGATIVE Final   Influenza B by PCR NEGATIVE NEGATIVE Final     Comment: (NOTE) The Xpert Xpress SARS-CoV-2/FLU/RSV plus assay is intended as an aid in the diagnosis of influenza from Nasopharyngeal swab specimens and should not be used as a sole basis for treatment. Nasal washings and aspirates are unacceptable for Xpert Xpress SARS-CoV-2/FLU/RSV testing.  Fact Sheet for Patients: BloggerCourse.com  Fact Sheet for Healthcare Providers: SeriousBroker.it  This test is not yet approved or cleared by the [place] FDA and has been authorized for detection and/or diagnosis of SARS-CoV-2 by FDA under an Emergency Use Authorization (EUA). This EUA will remain in effect (meaning this test can be used) for the duration of the COVID-19 declaration under Section 564(b)(1) of the Act, 21 U.S.C. section 360bbb-3(b)(1), unless the authorization is terminated or revoked.  Performed at [facility], [address] [place], [place] [address]   Urine culture     Status: None   Collection Time: 10/31/20  7:54 PM   Specimen: Urine, Random  Result Value Ref Range Status   Specimen Description   Final    URINE, RANDOM Performed at [facility], [address] [place], [place] [address]    Special Requests   Final    NONE Performed at [facility], [address] [place], [place] [address]    Culture   Final    NO GROWTH Performed at [facility], [address], [place], [place] [address]    Report Status 11/02/2020 FINAL  Final  Blood culture (routine x 2)     Status: None (Preliminary result)   Collection Time: 10/31/20  8:13 PM   Specimen: BLOOD LEFT ARM  Result Value Ref Range Status   Specimen Description   Final    BLOOD LEFT ARM Performed at [facility], [address] [place], [place] [address]    Special Requests   Final    BOTTLES DRAWN AEROBIC AND ANAEROBIC Blood Culture adequate volume Performed at [facility], [address] [place], [place] [address]    Culture   Final    NO GROWTH 3 DAYS Performed at [facility], [address] [place], [place] [address]    Report Status PENDING  Incomplete  Blood culture (routine x 2)     Status: None (Preliminary result)   Collection Time: 10/31/20  8:13 PM   Specimen: BLOOD RIGHT ARM  Result Value Ref Range Status   Specimen Description   Final    BLOOD RIGHT ARM Performed at [facility], [address] [place], [place] [address]    Special Requests   Final    BOTTLES DRAWN AEROBIC AND ANAEROBIC Blood Culture adequate volume Performed at [facility], [address] [place], [place] [address]    Culture   Final    NO GROWTH 3 DAYS Performed at [facility], [address] [place], [place] [address]    Report Status PENDING  Incomplete     Labs: Basic Metabolic Panel: Recent Labs  Lab 10/31/20 1446 11/01/20 0125 11/02/20 0521 11/03/20 0509  NA 135 136 134* 134*  K 4.0 3.6 3.7 3.4*  CL 98 101 101 102  CO2 22 25 25 24   GLUCOSE 107* 93 109* 92  BUN <5* <5* 10 10  CREATININE 0.42* 0.49* 0.46* 0.52*  CALCIUM 8.6* 8.7* 9.2 9.0   Liver Function Tests: Recent Labs  Lab 10/31/20 1446 11/01/20 0125 11/02/20 0521 11/03/20 0509  AST 100* 87* 60* 54*  ALT 27 25 23 24   ALKPHOS 99 97 79 90  BILITOT 10.6* 11.5* 14.6* 11.2*  PROT 8.6* 8.6* 8.2* 8.0  ALBUMIN 2.7* 2.8* 2.6* 2.6*   Recent Labs  Lab 10/31/20 1446  LIPASE 49   Recent Labs  Lab 10/31/20 1446  AMMONIA 61*   CBC: Recent Labs  Lab 10/31/20 1446 11/01/20 0125 11/02/20 0521 11/02/20 1546 11/03/20 0509  WBC 5.8 5.4 5.8  --  7.3  NEUTROABS 2.7  --  3.7  --   --   HGB 7.2* 7.1* 7.1* 8.4* 8.2*  HCT 22.3* 22.3* 22.3* 26.0* 24.3*  MCV 125.3* 126.7* 127.4*  --  118.3*  PLT 98* 99* 95*  --  94*   Cardiac Enzymes: No results for input(s): CKTOTAL, CKMB, CKMBINDEX, TROPONINI in the last 168 hours. BNP: BNP (last 3 results) No results  for input(s): BNP in the last 8760 hours.  ProBNP (last 3 results) No results for input(s): PROBNP in the last 8760 hours.  CBG: No results for input(s): GLUCAP in the last 168 hours.     Signed:  11/04/20 MD.  [facility] 11/03/2020, 1:14 PM

## 2020-11-05 LAB — CULTURE, BLOOD (ROUTINE X 2)
Culture: NO GROWTH
Culture: NO GROWTH
Special Requests: ADEQUATE
Special Requests: ADEQUATE

## 2020-11-17 ENCOUNTER — Emergency Department (HOSPITAL_BASED_OUTPATIENT_CLINIC_OR_DEPARTMENT_OTHER)
Admission: EM | Admit: 2020-11-17 | Discharge: 2020-11-17 | Disposition: A | Discharge status: Home / Self Care | Payer: 59 | Attending: Emergency Medicine | Admitting: Emergency Medicine

## 2020-11-17 ENCOUNTER — Encounter (HOSPITAL_BASED_OUTPATIENT_CLINIC_OR_DEPARTMENT_OTHER): Payer: Self-pay | Admitting: Emergency Medicine

## 2020-11-17 ENCOUNTER — Emergency Department (HOSPITAL_BASED_OUTPATIENT_CLINIC_OR_DEPARTMENT_OTHER): Payer: 59

## 2020-11-17 ENCOUNTER — Other Ambulatory Visit: Payer: Self-pay

## 2020-11-17 DIAGNOSIS — F17210 Nicotine dependence, cigarettes, uncomplicated: Secondary | ICD-10-CM | POA: Diagnosis not present

## 2020-11-17 DIAGNOSIS — M7989 Other specified soft tissue disorders: Secondary | ICD-10-CM | POA: Diagnosis present

## 2020-11-17 DIAGNOSIS — R600 Localized edema: Secondary | ICD-10-CM | POA: Diagnosis not present

## 2020-11-17 LAB — PROTIME-INR
INR: 2.3 — ABNORMAL HIGH (ref 0.8–1.2)
Prothrombin Time: 25.7 seconds — ABNORMAL HIGH (ref 11.4–15.2)

## 2020-11-17 LAB — CBC WITH DIFFERENTIAL/PLATELET
Abs Immature Granulocytes: 0.07 10*3/uL (ref 0.00–0.07)
Basophils Absolute: 0 10*3/uL (ref 0.0–0.1)
Basophils Relative: 0 %
Eosinophils Absolute: 0 10*3/uL (ref 0.0–0.5)
Eosinophils Relative: 0 %
HCT: 28.3 % — ABNORMAL LOW (ref 39.0–52.0)
Hemoglobin: 9.2 g/dL — ABNORMAL LOW (ref 13.0–17.0)
Immature Granulocytes: 1 %
Lymphocytes Relative: 12 %
Lymphs Abs: 1.3 10*3/uL (ref 0.7–4.0)
MCH: 36.2 pg — ABNORMAL HIGH (ref 26.0–34.0)
MCHC: 32.5 g/dL (ref 30.0–36.0)
MCV: 111.4 fL — ABNORMAL HIGH (ref 80.0–100.0)
Monocytes Absolute: 0.9 10*3/uL (ref 0.1–1.0)
Monocytes Relative: 9 %
Neutro Abs: 8.2 10*3/uL — ABNORMAL HIGH (ref 1.7–7.7)
Neutrophils Relative %: 78 %
Platelets: 157 10*3/uL (ref 150–400)
RBC: 2.54 MIL/uL — ABNORMAL LOW (ref 4.22–5.81)
RDW: 18.4 % — ABNORMAL HIGH (ref 11.5–15.5)
Smear Review: NORMAL
WBC: 10.5 10*3/uL (ref 4.0–10.5)
nRBC: 0 % (ref 0.0–0.2)

## 2020-11-17 LAB — BASIC METABOLIC PANEL
Anion gap: 13 (ref 5–15)
BUN: 10 mg/dL (ref 6–20)
CO2: 19 mmol/L — ABNORMAL LOW (ref 22–32)
Calcium: 9 mg/dL (ref 8.9–10.3)
Chloride: 101 mmol/L (ref 98–111)
Creatinine, Ser: 0.69 mg/dL (ref 0.61–1.24)
GFR, Estimated: >60 mL/min (ref >60)
Glucose, Bld: 115 mg/dL — ABNORMAL HIGH (ref 70–99)
Potassium: 3.6 mmol/L (ref 3.5–5.1)
Sodium: 133 mmol/L — ABNORMAL LOW (ref 135–145)

## 2020-11-17 LAB — HEPATIC FUNCTION PANEL
ALT: 52 U/L — ABNORMAL HIGH (ref 0–44)
AST: 59 U/L — ABNORMAL HIGH (ref 15–41)
Albumin: 2.7 g/dL — ABNORMAL LOW (ref 3.5–5.0)
Alkaline Phosphatase: 121 U/L (ref 38–126)
Bilirubin, Direct: 3.8 mg/dL — ABNORMAL HIGH (ref 0.0–0.2)
Indirect Bilirubin: 5.6 mg/dL — ABNORMAL HIGH (ref 0.3–0.9)
Total Bilirubin: 9.4 mg/dL — ABNORMAL HIGH (ref 0.3–1.2)
Total Protein: 8.2 g/dL — ABNORMAL HIGH (ref 6.5–8.1)

## 2020-11-17 NOTE — ED Notes (Signed)
"  BLE swelling off and on for about four days. Denies pain, sob. Just feels tight."

## 2020-11-17 NOTE — ED Provider Notes (Signed)
[facility] EMERGENCY DEPARTMENT Provider Note   CSN: [id number] Arrival date & time: 11/17/20  2023     History Chief Complaint  Patient presents with  . Leg Swelling    Seth Holmes is a 45 y.o. male.  Leg swelling.  Worsening here recently.  Improves with positioning.  Stands on his feet all day at work and he gets worse.  Tries nothing for it.  Has no shortness of breath has no chest pain has no fevers has no color change.  Has no significant discomfort other than pain and tingling.        Past Medical History:  Diagnosis Date  . H. pylori infection     Patient Active Problem List   Diagnosis Date Noted  . Alcoholic hepatitis 11/03/2020  . 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 Supratherapeutic INR 11/01/2020  . Macrocytic anemia 11/01/2020  . Alcohol use disorder, severe, dependence (HCC)   . Common bile duct dilation   . Transaminitis   . Hyperbilirubinemia 10/31/2020  . Dilation of common bile duct 10/31/2020    Past Surgical History:  Procedure Laterality Date  . APPENDECTOMY    . COLONOSCOPY W/ BIOPSIES AND POLYPECTOMY         History reviewed. No pertinent family history.  Social History   Tobacco Use  . Smoking status: Current Every Day Smoker    Packs/day: 0.50    Types: Cigarettes  . Smokeless tobacco: Never Used  Substance Use Topics  . Alcohol use: Yes  . Drug use: No    Home Medications Prior to Admission medications   Medication Sig Start Date End Date Taking? Authorizing Provider  pantoprazole (PROTONIX) 40 MG tablet Take 1 tablet (40 mg total) by mouth daily. 11/03/20  Yes Rodolph Bong, MD  prednisoLONE 5 MG TABS tablet Take 8 tablets (40 mg total) by mouth daily. 11/04/20 12/04/20 Yes Rodolph Bong, MD  folic acid (FOLVITE) 1 MG tablet Take 1 tablet (1 mg total) by mouth daily. 11/04/20   Rodolph Bong, MD  thiamine 100 MG tablet Take 1 tablet (100 mg total) by mouth daily. 11/04/20    Rodolph Bong, MD    Allergies    Patient has no known allergies.  Review of Systems   Review of Systems  Constitutional: Negative for chills and fever.  HENT: Negative for congestion and rhinorrhea.   Respiratory: Negative for cough and shortness of breath.   Cardiovascular: Positive for leg swelling. Negative for chest pain and palpitations.  Gastrointestinal: Negative for diarrhea, nausea and vomiting.  Genitourinary: Negative for difficulty urinating and dysuria.  Musculoskeletal: Negative for arthralgias and back pain.  Skin: Negative for color change and rash.  Neurological: Negative for light-headedness and headaches.    Physical Exam Updated Vital Signs BP 110/64 (BP Location: Right Arm)   Pulse 76   Temp 98.1 F (36.7 C) (Oral)   Resp 20   SpO2 100%   Physical Exam Vitals and nursing note reviewed.  Constitutional:      General: He is not in acute distress.    Appearance: Normal appearance.  HENT:     Head: Normocephalic and atraumatic.     Nose: No rhinorrhea.  Eyes:     General:        Right eye: No discharge.        Left eye: No discharge.     Conjunctiva/sclera: Conjunctivae normal.  Cardiovascular:     Rate and Rhythm: Normal  rate and regular rhythm.  Pulmonary:     Effort: Pulmonary effort is normal.     Breath sounds: No stridor.  Abdominal:     General: Abdomen is flat. There is no distension.     Palpations: Abdomen is soft.  Musculoskeletal:        General: No deformity or signs of injury.     Right lower leg: Edema (2+ for the knee) present.     Left lower leg: Edema (2+ to the knee) present.  Skin:    General: Skin is warm and dry.  Neurological:     General: No focal deficit present.     Mental Status: He is alert. Mental status is at baseline.     Motor: No weakness.  Psychiatric:        Mood and Affect: Mood normal.        Behavior: Behavior normal.        Thought Content: Thought content normal.     ED Results /  Procedures / Treatments   Labs (all labs ordered are listed, but only abnormal results are displayed) Labs Reviewed  CBC WITH DIFFERENTIAL/PLATELET - Abnormal; Notable for the following components:      Result Value   RBC 2.54 (*)    Hemoglobin 9.2 (*)    HCT 28.3 (*)    MCV 111.4 (*)    MCH 36.2 (*)    RDW 18.4 (*)    Neutro Abs 8.2 (*)    All other components within normal limits  BASIC METABOLIC PANEL - Abnormal; Notable for the following components:   Sodium 133 (*)    CO2 19 (*)    Glucose, Bld 115 (*)    All other components within normal limits  HEPATIC FUNCTION PANEL - Abnormal; Notable for the following components:   Total Protein 8.2 (*)    Albumin 2.7 (*)    AST 59 (*)    ALT 52 (*)    Total Bilirubin 9.4 (*)    Bilirubin, Direct 3.8 (*)    Indirect Bilirubin 5.6 (*)    All other components within normal limits  PROTIME-INR - Abnormal; Notable for the following components:   Prothrombin Time 25.7 (*)    INR 2.3 (*)    All other components within normal limits    EKG None  Radiology US Venous Img Lower Bilateral  Result Date: 11/17/2020 CLINICAL DATA:  Bilateral lower extremity edema for 4 days EXAM: BILATERAL LOWER EXTREMITY VENOUS DOPPLER ULTRASOUND TECHNIQUE: Gray-scale sonography with compression, as well as color and duplex ultrasound, were performed to evaluate the deep venous system(s) from the level of the common femoral vein through the popliteal and proximal calf veins. COMPARISON:  10/31/2020 FINDINGS: VENOUS Normal compressibility of the common femoral, superficial femoral, and popliteal veins, as well as the visualized calf veins. Visualized portions of profunda femoral vein and great saphenous vein unremarkable. No filling defects to suggest DVT on grayscale or color Doppler imaging. Doppler waveforms show normal direction of venous flow, normal respiratory plasticity and response to augmentation. OTHER None. Limitations: none IMPRESSION: 1. No evidence  of deep venous thrombosis within either lower extremity. Electronically Signed   By: Sharlet Salina M.D.   On: 11/17/2020 22:25    Procedures Procedures   Medications Ordered in ED Medications - No data to display  ED Course  I have reviewed the triage vital signs and the nursing notes.  Pertinent labs & imaging results that were available during my care of  the patient were reviewed by me and considered in my medical decision making (see chart for details).    MDM Rules/Calculators/A&P                          Pitting edema bilateral legs.  Will evaluate for organ dysfunction.  Will evaluate for DVT.  Recently started on prednisone for alcoholic hepatitis.  This is a side effect of that medication.  If work-up is negative discharged home with outpatient follow-up compression socks recommended.  Review of patient's chart shows he has chronic liver dysfunction likely secondary to alcoholism.  He is on prednisone for this.  This may be related as it and his fluid retention is a common adverse reaction.  His laboratory studies after my review are at baseline with no significant changes.  Ultrasound is negative.  Safe for discharge home supportive care measures outpatient follow-up recommended.  He is instructed to continue all medications by his gastroenterologist and to try compression socks for the swelling. Final Clinical Impression(s) / ED Diagnoses Final diagnoses:  Leg edema    Rx / DC Orders ED Discharge Orders    None       Sabino Donovan, MD 11/17/20 2245

## 2020-11-17 NOTE — Discharge Instructions (Addendum)
Continue to take all medication as prescribed from a recent hospitalization.  Go to the local pharmacy of your choice and ask for compression socks.  Wear these when up and moving around.  When you are not up and moving around elevate your legs to help with the swelling.

## 2020-11-17 NOTE — ED Triage Notes (Addendum)
Pt presents to ED POv. Pt c/o bilateral leg and feet swelling, and tingling in LE. Pt reports began 4d ago, wrosens after standing all day at work. No SOB. Unsure if he takes fluid pill at home

## 2020-12-13 ENCOUNTER — Inpatient Hospital Stay: Admission: AD | Admit: 2020-12-13 | Payer: 59 | Source: Other Acute Inpatient Hospital | Admitting: Internal Medicine

## 2021-06-02 IMAGING — US US ABDOMEN LIMITED RUQ/ASCITES
1 series · 14 of 25 positions shown · non-contrast
Comparison: Abdominal ultrasound 10/22/2019

CLINICAL DATA: Transaminitis with elevated bilirubin.  Alcoholic.

EXAM:
ULTRASOUND ABDOMEN LIMITED RIGHT UPPER QUADRANT

[Series 1: us abdomen limited ruq/ascites · 14 of 63 slices shown]
[im 1/63]
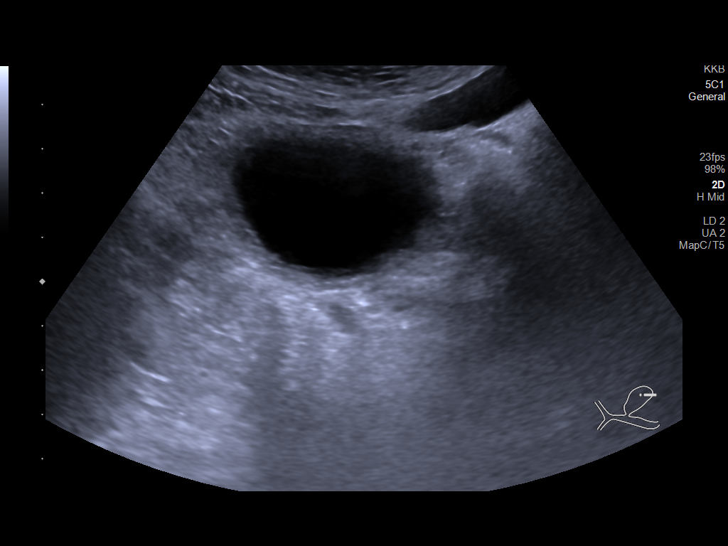
[im 6/63]
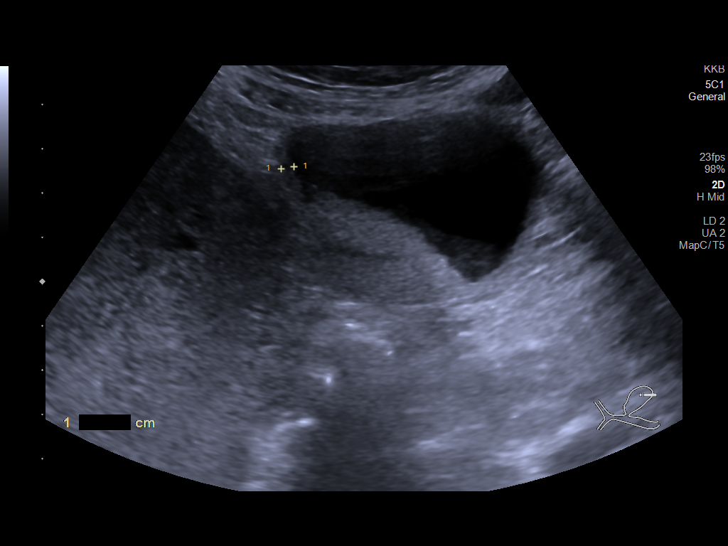
[im 11/63]
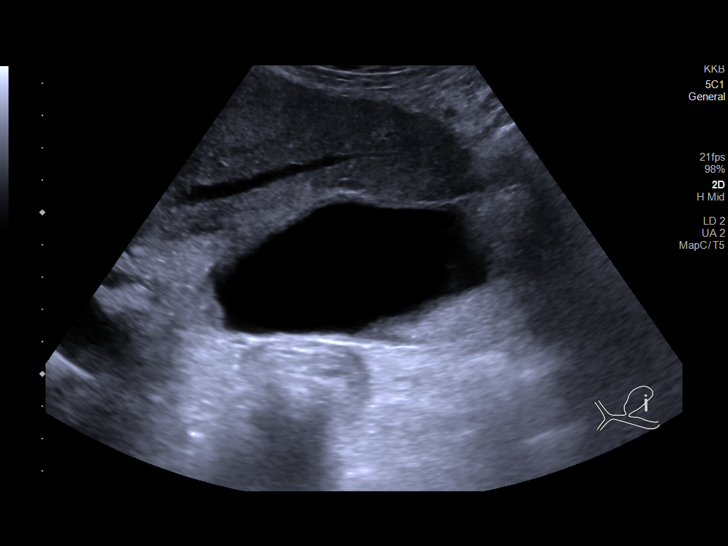
[im 16/63]
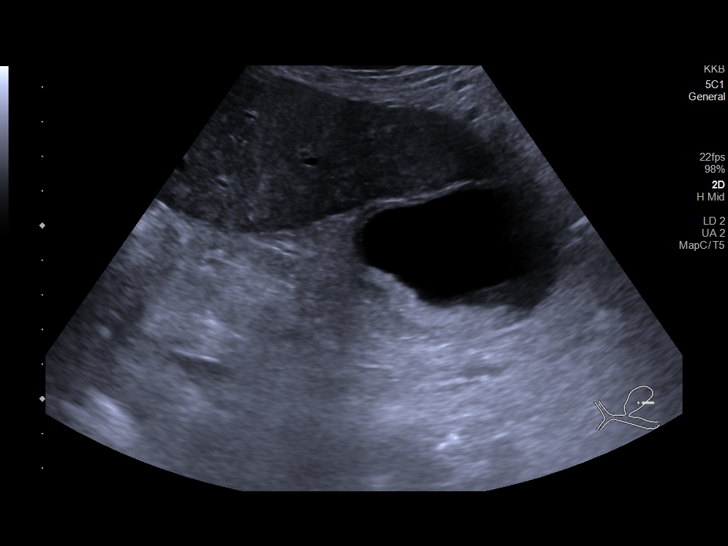
[im 21/63]
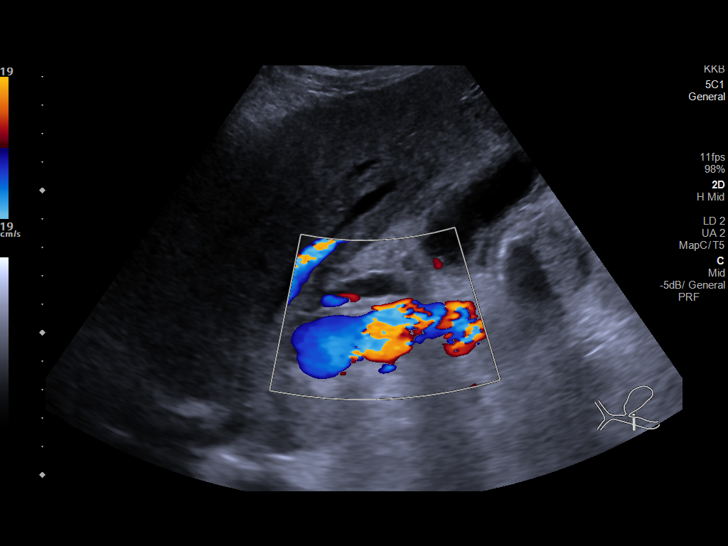
[im 24/63]
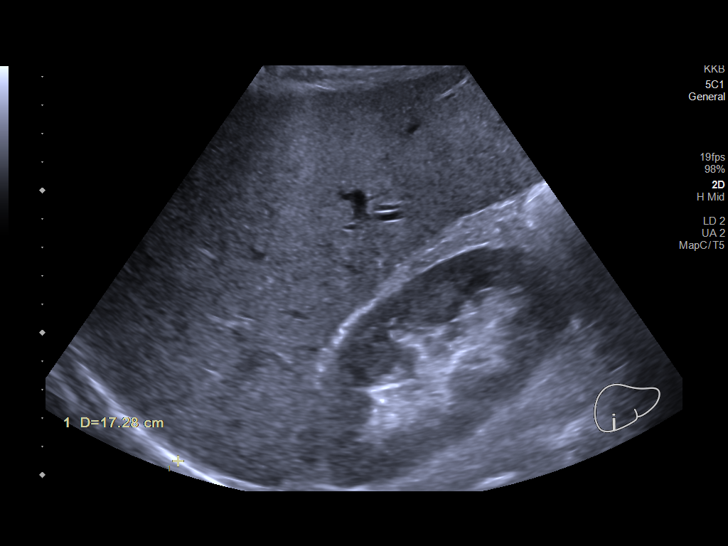
[im 29/63]
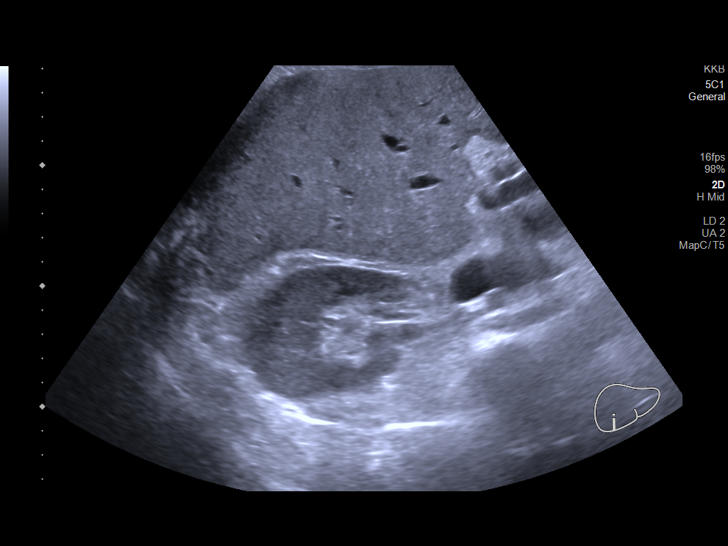
[im 34/63]
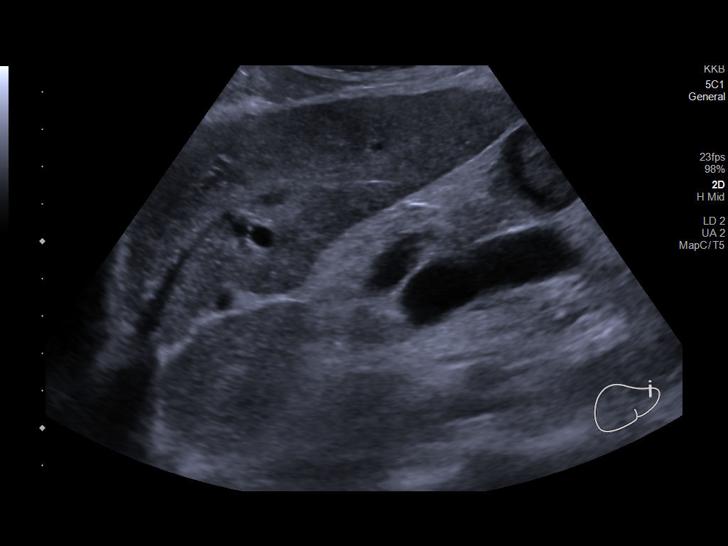
[im 39/63]
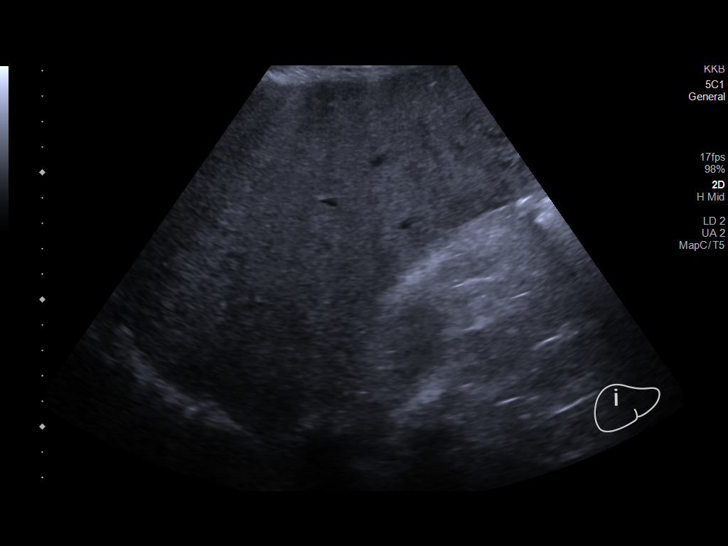
[im 42/63]
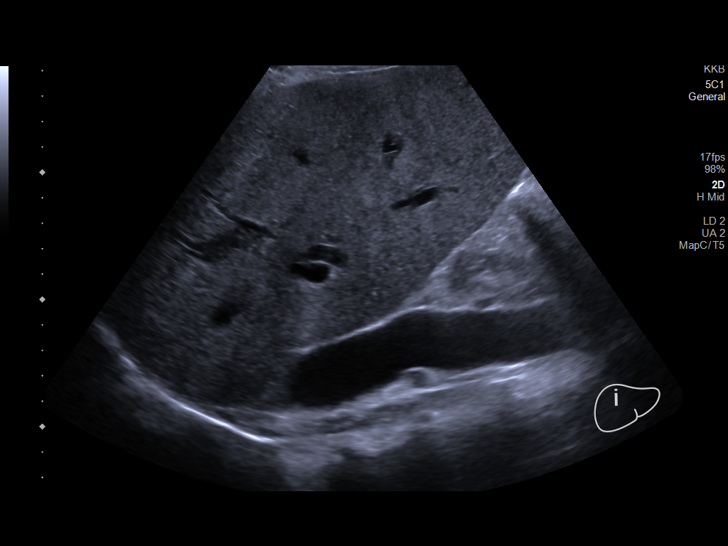
[im 47/63]
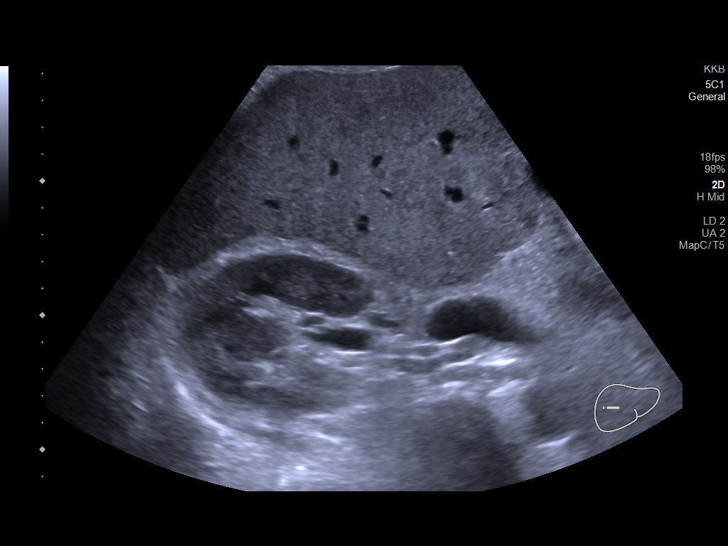
[im 52/63]
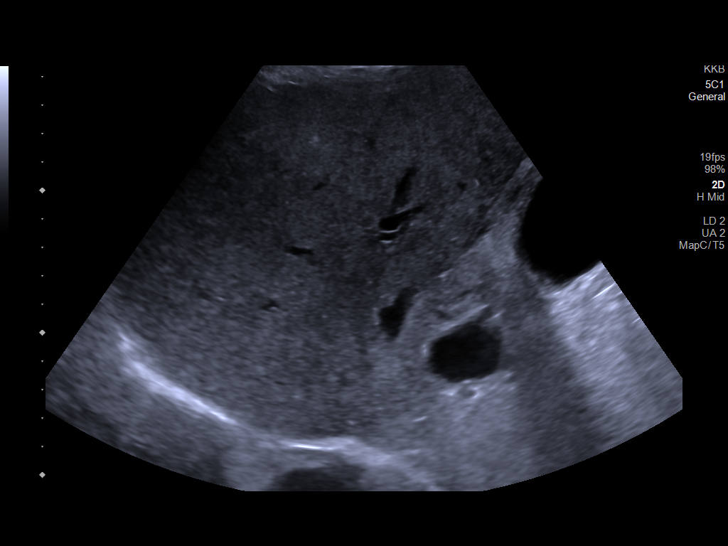
[im 57/63]
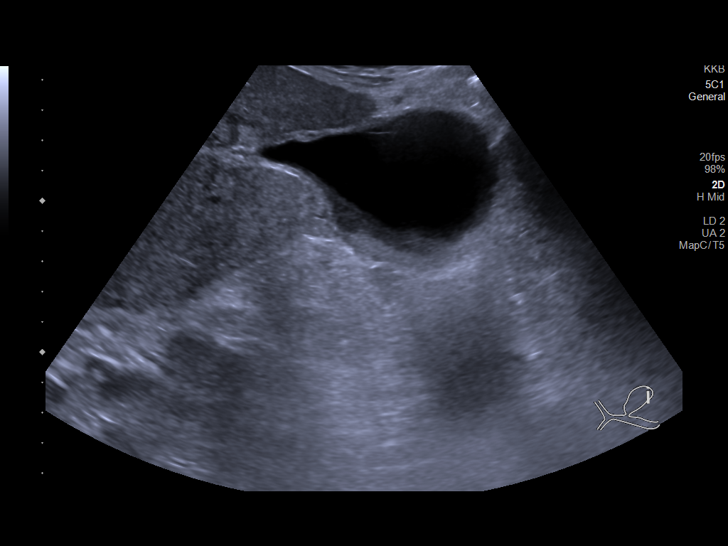
[im 63/63]
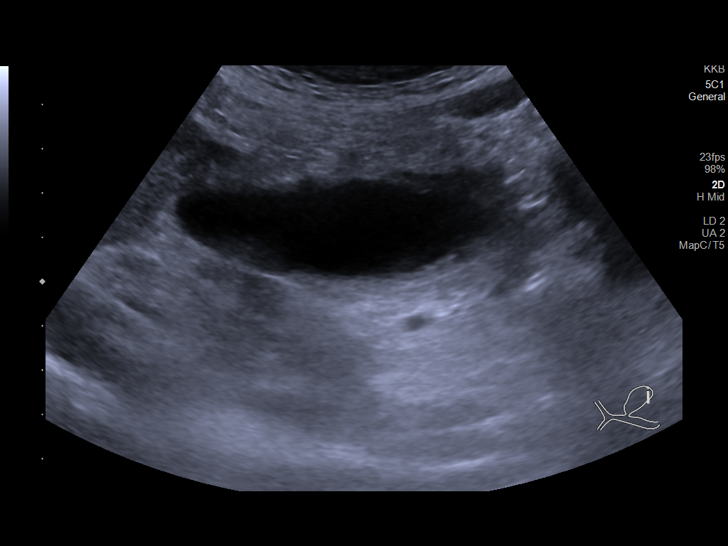

[14 of 25 positions shown; findings below may reference images not displayed]

FINDINGS: Gallbladder:

Distended containing layering sludge. No gallstones or wall
thickening visualized. No sonographic Murphy sign noted by
sonographer.

Common bile duct:

Diameter: 8-9 mm.

Liver:

Heterogeneous and coarsened parenchymal echogenicity. Borderline
increased compared to right kidney. No definite capsular nodularity.
Previous intrahepatic biliary ductal dilatation is not as well
appreciated currently. Portal vein is patent on color Doppler
imaging with normal direction of blood flow towards the liver.

Other: No right upper quadrant ascites.
IMPRESSION: 1. Distended gallbladder containing layering sludge. No sonographic
findings of acute cholecystitis.
2. Dilated common bile duct at 9 mm, cause not delineated. Previous
intrahepatic biliary ductal dilatation is less well appreciated on
the current exam.
3. Coarsened hepatic echogenicity with borderline increased compared
to right kidney, suggestive of chronic liver disease.

## 2021-06-19 IMAGING — US US EXTREM LOW VENOUS
1 series · 14 of 24 positions shown · non-contrast
Comparison: 10/31/2020

CLINICAL DATA: Bilateral lower extremity edema for 4 days

EXAM:
BILATERAL LOWER EXTREMITY VENOUS DOPPLER ULTRASOUND
TECHNIQUE: Gray-scale sonography with compression, as well as color and duplex
ultrasound, were performed to evaluate the deep venous system(s)
from the level of the common femoral vein through the popliteal and
proximal calf veins.

[Series 1: us extrem low venous · 14 of 70 slices shown]
[im 1/70]
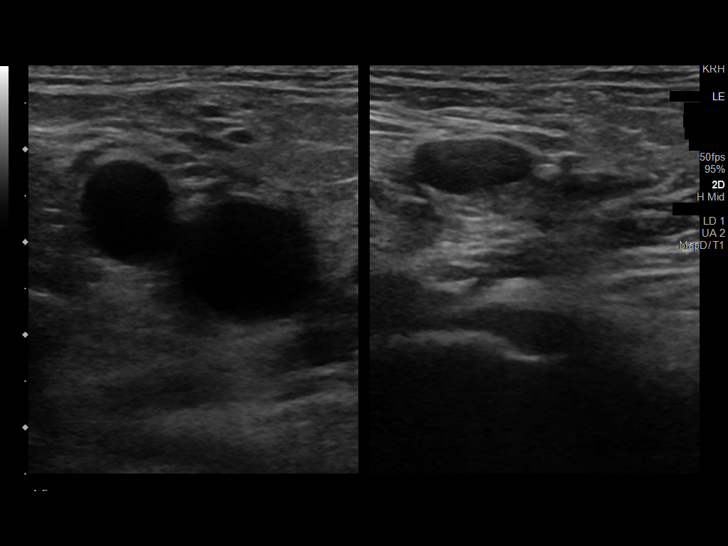
[im 7/70]
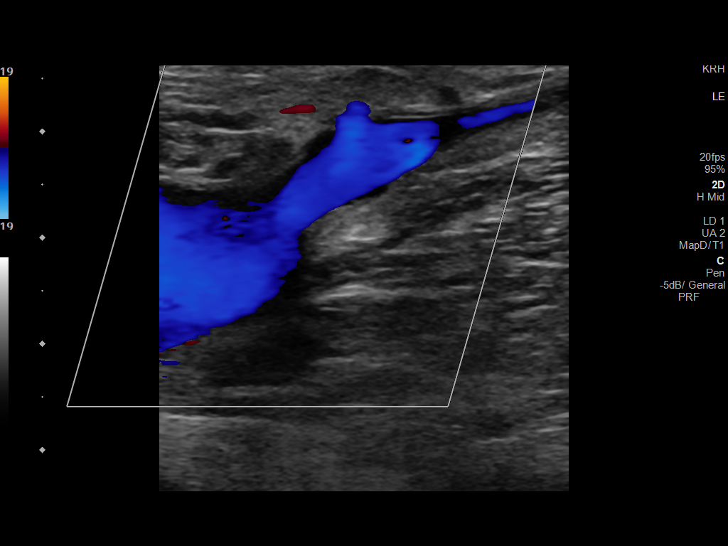
[im 13/70]
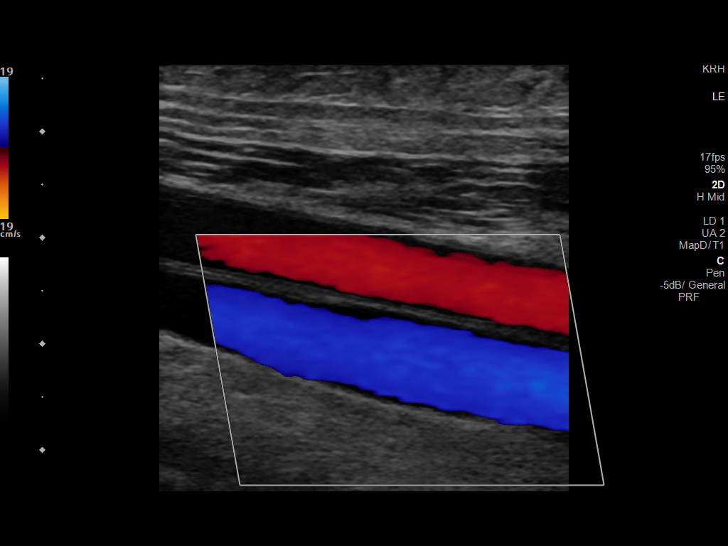
[im 19/70]
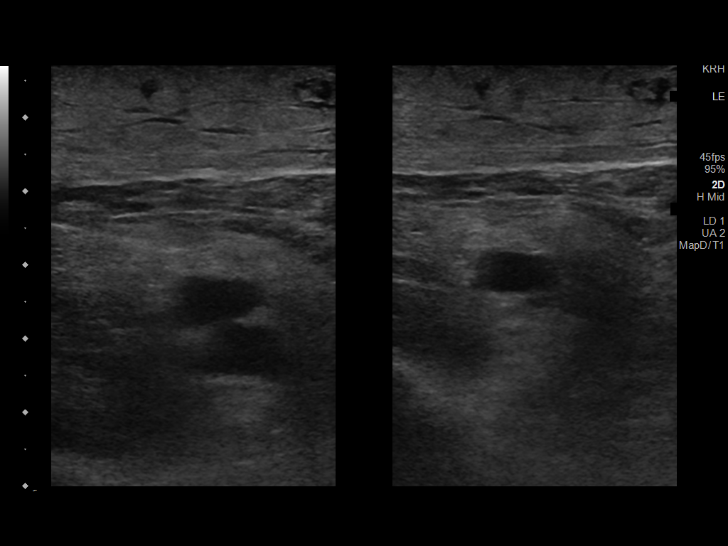
[im 22/70]
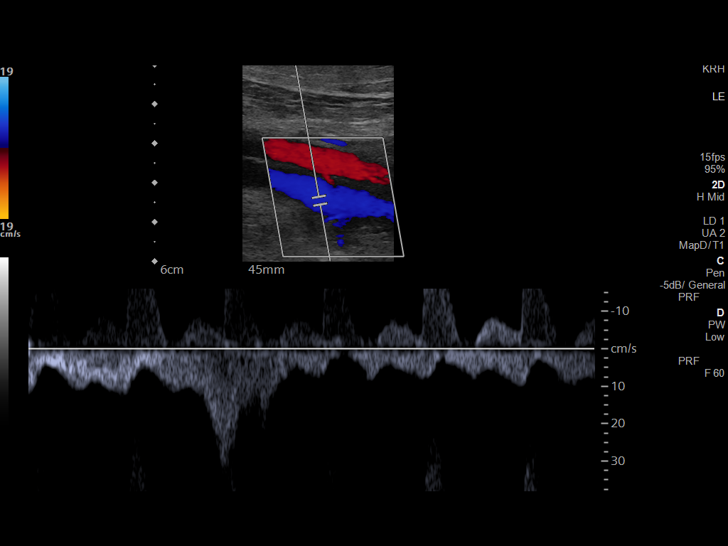
[im 28/70]
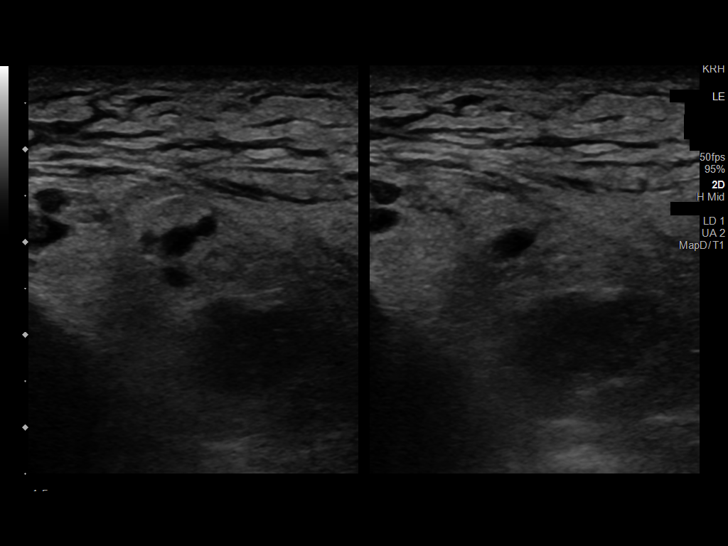
[im 34/70]
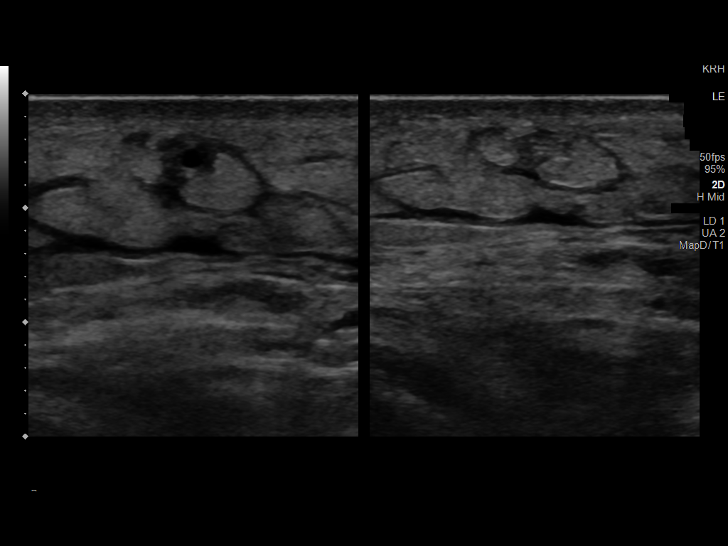
[im 37/70]
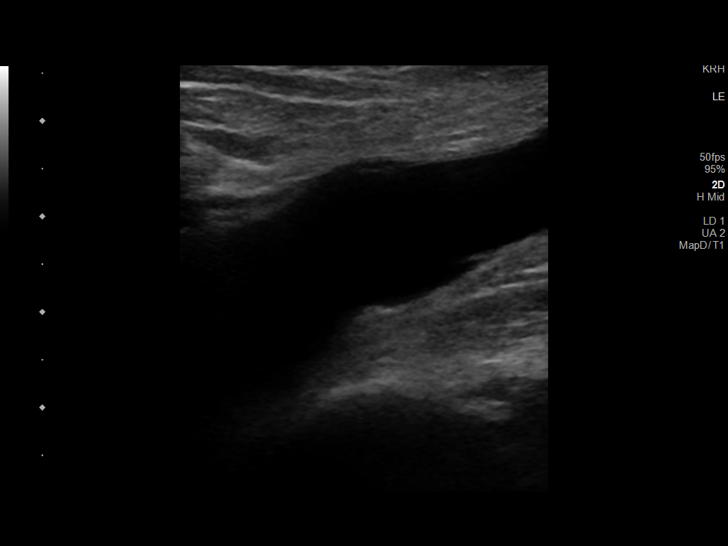
[im 43/70]
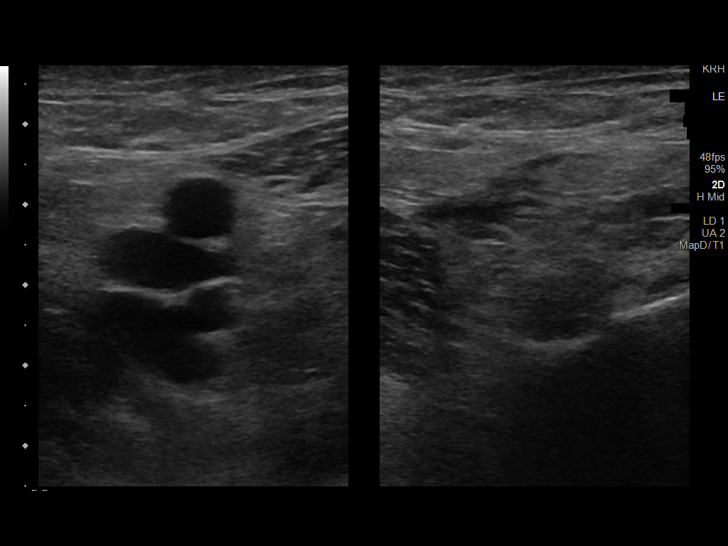
[im 49/70]
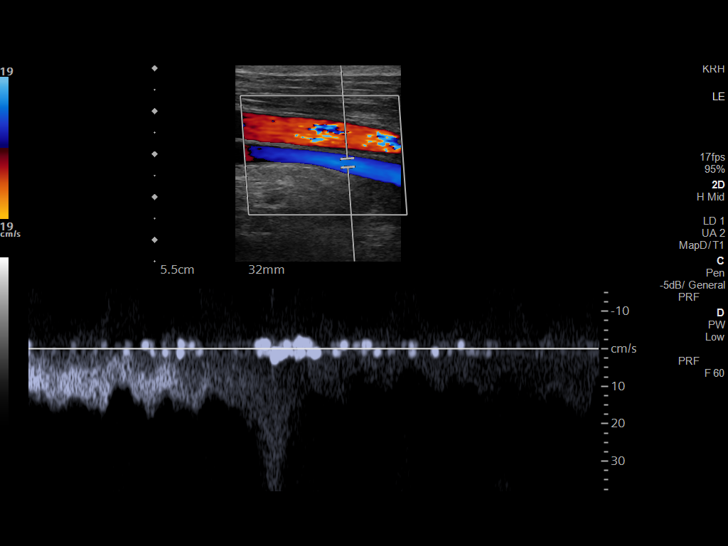
[im 55/70]
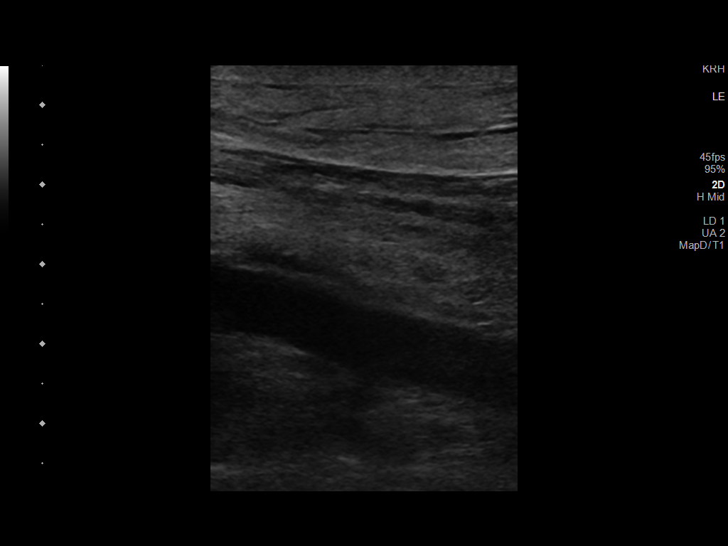
[im 58/70]
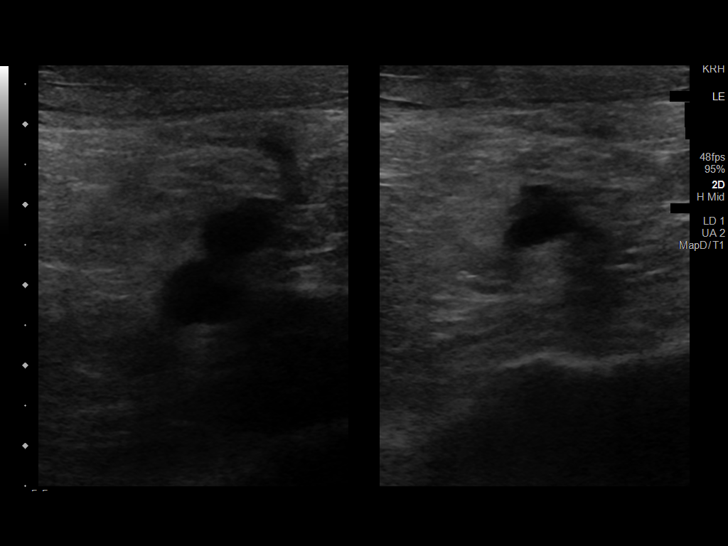
[im 64/70]
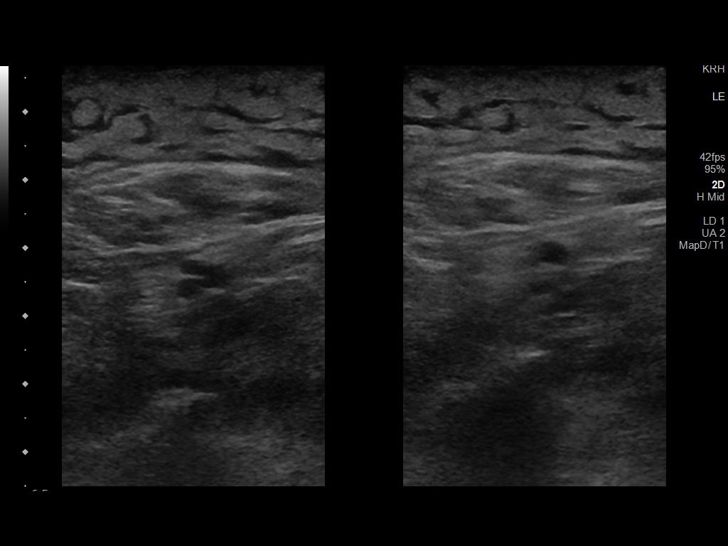
[im 70/70]
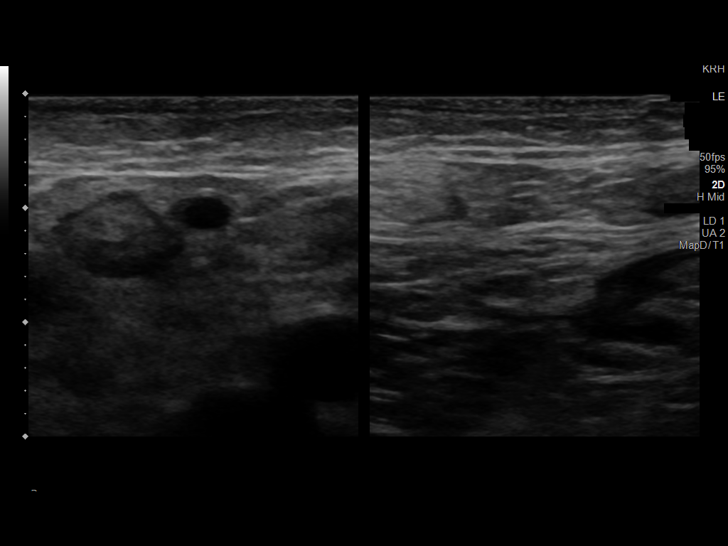

[14 of 24 positions shown; findings below may reference images not displayed]

FINDINGS: VENOUS

Normal compressibility of the common femoral, superficial femoral,
and popliteal veins, as well as the visualized calf veins.
Visualized portions of profunda femoral vein and great saphenous
vein unremarkable. No filling defects to suggest DVT on grayscale or
color Doppler imaging. Doppler waveforms show normal direction of
venous flow, normal respiratory plasticity and response to
augmentation.

OTHER

None.

Limitations: none
IMPRESSION: 1. No evidence of deep venous thrombosis within either lower
extremity.

## 2022-05-16 ENCOUNTER — Encounter (HOSPITAL_COMMUNITY): Payer: Self-pay

## 2022-05-16 ENCOUNTER — Inpatient Hospital Stay (HOSPITAL_BASED_OUTPATIENT_CLINIC_OR_DEPARTMENT_OTHER)
Admission: EM | Admit: 2022-05-16 | Discharge: 2022-05-21 | DRG: 432 | Disposition: A | Discharge status: Home / Self Care | Payer: 59 | Attending: Internal Medicine | Admitting: Internal Medicine

## 2022-05-16 ENCOUNTER — Other Ambulatory Visit: Payer: Self-pay

## 2022-05-16 ENCOUNTER — Emergency Department (HOSPITAL_BASED_OUTPATIENT_CLINIC_OR_DEPARTMENT_OTHER): Payer: 59

## 2022-05-16 ENCOUNTER — Encounter (HOSPITAL_BASED_OUTPATIENT_CLINIC_OR_DEPARTMENT_OTHER): Payer: Self-pay

## 2022-05-16 DIAGNOSIS — F1010 Alcohol abuse, uncomplicated: Secondary | ICD-10-CM | POA: Diagnosis present

## 2022-05-16 DIAGNOSIS — K3189 Other diseases of stomach and duodenum: Secondary | ICD-10-CM | POA: Diagnosis present

## 2022-05-16 DIAGNOSIS — R5381 Other malaise: Secondary | ICD-10-CM | POA: Diagnosis present

## 2022-05-16 DIAGNOSIS — K7011 Alcoholic hepatitis with ascites: Secondary | ICD-10-CM | POA: Diagnosis present

## 2022-05-16 DIAGNOSIS — K766 Portal hypertension: Secondary | ICD-10-CM | POA: Diagnosis present

## 2022-05-16 DIAGNOSIS — K7040 Alcoholic hepatic failure without coma: Secondary | ICD-10-CM | POA: Diagnosis present

## 2022-05-16 DIAGNOSIS — D638 Anemia in other chronic diseases classified elsewhere: Secondary | ICD-10-CM | POA: Diagnosis present

## 2022-05-16 DIAGNOSIS — R7989 Other specified abnormal findings of blood chemistry: Secondary | ICD-10-CM

## 2022-05-16 DIAGNOSIS — D6959 Other secondary thrombocytopenia: Secondary | ICD-10-CM | POA: Diagnosis present

## 2022-05-16 DIAGNOSIS — N179 Acute kidney failure, unspecified: Secondary | ICD-10-CM

## 2022-05-16 DIAGNOSIS — K7031 Alcoholic cirrhosis of liver with ascites: Secondary | ICD-10-CM | POA: Diagnosis not present

## 2022-05-16 DIAGNOSIS — Z79899 Other long term (current) drug therapy: Secondary | ICD-10-CM

## 2022-05-16 DIAGNOSIS — F17210 Nicotine dependence, cigarettes, uncomplicated: Secondary | ICD-10-CM | POA: Diagnosis present

## 2022-05-16 DIAGNOSIS — E876 Hypokalemia: Secondary | ICD-10-CM | POA: Diagnosis present

## 2022-05-16 DIAGNOSIS — E871 Hypo-osmolality and hyponatremia: Secondary | ICD-10-CM | POA: Diagnosis present

## 2022-05-16 DIAGNOSIS — K922 Gastrointestinal hemorrhage, unspecified: Principal | ICD-10-CM

## 2022-05-16 DIAGNOSIS — K7200 Acute and subacute hepatic failure without coma: Secondary | ICD-10-CM | POA: Diagnosis present

## 2022-05-16 DIAGNOSIS — K7682 Hepatic encephalopathy: Secondary | ICD-10-CM | POA: Diagnosis present

## 2022-05-16 DIAGNOSIS — D62 Acute posthemorrhagic anemia: Secondary | ICD-10-CM | POA: Diagnosis present

## 2022-05-16 DIAGNOSIS — R0602 Shortness of breath: Secondary | ICD-10-CM | POA: Diagnosis not present

## 2022-05-16 DIAGNOSIS — E8770 Fluid overload, unspecified: Secondary | ICD-10-CM | POA: Diagnosis present

## 2022-05-16 DIAGNOSIS — R9431 Abnormal electrocardiogram [ECG] [EKG]: Secondary | ICD-10-CM | POA: Diagnosis present

## 2022-05-16 DIAGNOSIS — D684 Acquired coagulation factor deficiency: Secondary | ICD-10-CM | POA: Diagnosis present

## 2022-05-16 DIAGNOSIS — E8809 Other disorders of plasma-protein metabolism, not elsewhere classified: Secondary | ICD-10-CM | POA: Diagnosis present

## 2022-05-16 DIAGNOSIS — J810 Acute pulmonary edema: Secondary | ICD-10-CM | POA: Diagnosis present

## 2022-05-16 DIAGNOSIS — K7290 Hepatic failure, unspecified without coma: Secondary | ICD-10-CM

## 2022-05-16 HISTORY — DX: Unspecified cirrhosis of liver: K74.60

## 2022-05-16 LAB — PROTIME-INR
INR: 3.4 — ABNORMAL HIGH (ref 0.8–1.2)
Prothrombin Time: 34.4 seconds — ABNORMAL HIGH (ref 11.4–15.2)

## 2022-05-16 LAB — URINALYSIS, ROUTINE W REFLEX MICROSCOPIC
Glucose, UA: 100 mg/dL — AB
Ketones, ur: NEGATIVE mg/dL
Leukocytes,Ua: NEGATIVE
Nitrite: NEGATIVE
Protein, ur: 30 mg/dL — AB
Specific Gravity, Urine: 1.01 (ref 1.005–1.030)
pH: 7 (ref 5.0–8.0)

## 2022-05-16 LAB — URINALYSIS, MICROSCOPIC (REFLEX): WBC, UA: NONE SEEN WBC/hpf (ref 0–5)

## 2022-05-16 LAB — TROPONIN I (HIGH SENSITIVITY)
Troponin I (High Sensitivity): 50 ng/L — ABNORMAL HIGH (ref <18)
Troponin I (High Sensitivity): 46 ng/L — ABNORMAL HIGH (ref <18)

## 2022-05-16 LAB — BRAIN NATRIURETIC PEPTIDE: B Natriuretic Peptide: 848.5 pg/mL — ABNORMAL HIGH (ref 0.0–100.0)

## 2022-05-16 LAB — COMPREHENSIVE METABOLIC PANEL
ALT: 58 U/L — ABNORMAL HIGH (ref 0–44)
AST: 122 U/L — ABNORMAL HIGH (ref 15–41)
Albumin: 2.7 g/dL — ABNORMAL LOW (ref 3.5–5.0)
Alkaline Phosphatase: 79 U/L (ref 38–126)
Anion gap: 11 (ref 5–15)
BUN: 13 mg/dL (ref 6–20)
CO2: 24 mmol/L (ref 22–32)
Calcium: 8.7 mg/dL — ABNORMAL LOW (ref 8.9–10.3)
Chloride: 95 mmol/L — ABNORMAL LOW (ref 98–111)
Creatinine, Ser: 1.51 mg/dL — ABNORMAL HIGH (ref 0.61–1.24)
GFR, Estimated: 58 mL/min — ABNORMAL LOW (ref >60)
Glucose, Bld: 101 mg/dL — ABNORMAL HIGH (ref 70–99)
Potassium: 2.5 mmol/L — CL (ref 3.5–5.1)
Sodium: 130 mmol/L — ABNORMAL LOW (ref 135–145)
Total Bilirubin: 33.9 mg/dL (ref 0.3–1.2)
Total Protein: 7.7 g/dL (ref 6.5–8.1)

## 2022-05-16 LAB — MAGNESIUM: Magnesium: 1.9 mg/dL (ref 1.7–2.4)

## 2022-05-16 LAB — AMMONIA: Ammonia: 72 umol/L — ABNORMAL HIGH (ref 9–35)

## 2022-05-16 LAB — OCCULT BLOOD X 1 CARD TO LAB, STOOL: Fecal Occult Bld: POSITIVE — AB

## 2022-05-16 MED ORDER — CHLORHEXIDINE GLUCONATE CLOTH 2 % EX PADS
6.0000 | MEDICATED_PAD | Freq: Every day | CUTANEOUS | Status: DC
  Administered 2022-05-17: 6 via TOPICAL
  Filled 2022-05-16: qty 6

## 2022-05-16 MED ORDER — ORAL CARE MOUTH RINSE
15.0000 mL | OROMUCOSAL | Status: DC
  Administered 2022-05-17 – 2022-05-21 (×13): 15 mL via OROMUCOSAL
  Filled 2022-05-16: qty 15

## 2022-05-16 MED ORDER — POTASSIUM CHLORIDE 10 MEQ/100ML IV SOLN
10.0000 meq | INTRAVENOUS | Status: AC
  Administered 2022-05-16 (×2): 10 meq via INTRAVENOUS
  Filled 2022-05-16 (×2): qty 100

## 2022-05-16 MED ORDER — ORAL CARE MOUTH RINSE
15.0000 mL | OROMUCOSAL | Status: DC | PRN
  Filled 2022-05-16: qty 15

## 2022-05-16 NOTE — ED Triage Notes (Addendum)
Patient reports bilateral leg and feet swelling. Patient reports he was a heavy drinker up until 3 weeks ago.  Reports he has been to multiple docs and prescribed fluids pills with no relief.  Patient appears jaundice. Patient is slow to answer questions and unable to tell me what this place is called Reports he drove himself here. Unable to assess pulses in bilateral feet due to 4+ pitting edema. Patient has hep A

## 2022-05-16 NOTE — ED Provider Notes (Signed)
[facility] EMERGENCY DEPARTMENT Provider Note   CSN: [id number] Arrival date & time: 05/16/22  1428     History  Chief Complaint  Patient presents with   Leg Swelling    Seth Holmes is a 46 y.o. male.  The history is provided by the patient and medical records. No language interpreter was used.  Illness Location:  Fatigue, edema Severity:  Severe Onset quality:  Gradual Timing:  Constant Progression:  Worsening Chronicity:  New Associated symptoms: fatigue and shortness of breath (mild)   Associated symptoms: no abdominal pain, no chest pain, no congestion, no cough, no diarrhea, no fever, no headaches, no loss of consciousness, no nausea, no rash, no rhinorrhea, no vomiting and no wheezing        Home Medications Prior to Admission medications   Medication Sig Start Date End Date Taking? Authorizing Provider  furosemide (LASIX) 20 MG tablet Take 1 tablet by mouth daily. 02/27/22 05/28/22 Yes [provider]  sucralfate (CARAFATE) 1 GM/10ML suspension Take by mouth. 12/16/20  Yes [provider]  folic acid (FOLVITE) 1 MG tablet Take 1 tablet (1 mg total) by mouth daily. 11/04/20   Eugenie Filler, MD  pantoprazole (PROTONIX) 40 MG tablet Take 1 tablet (40 mg total) by mouth daily. 11/03/20   Eugenie Filler, MD  thiamine 100 MG tablet Take 1 tablet (100 mg total) by mouth daily. 11/04/20   Eugenie Filler, MD      Allergies    Patient has no known allergies.    Review of Systems   Review of Systems  Constitutional:  Positive for fatigue. Negative for chills and fever.  HENT:  Negative for congestion and rhinorrhea.   Eyes:  Negative for visual disturbance.  Respiratory:  Positive for shortness of breath (mild). Negative for cough, chest tightness and wheezing.   Cardiovascular:  Positive for leg swelling. Negative for chest pain and palpitations.  Gastrointestinal:  Positive for abdominal distention. Negative for abdominal pain,  constipation, diarrhea, nausea and vomiting.  Genitourinary:  Negative for dysuria, flank pain and frequency.       Darker urine  Musculoskeletal:  Negative for back pain, neck pain and neck stiffness.  Skin:  Negative for rash and wound.  Neurological:  Negative for dizziness, loss of consciousness, weakness, light-headedness, numbness and headaches.  Psychiatric/Behavioral:  Positive for confusion (at times). Negative for agitation.   All other systems reviewed and are negative.   Physical Exam Updated Vital Signs BP (!) 109/58   Pulse 94   Temp 98.2 F (36.8 C) (Oral)   Resp 15   Ht 5\' 9"  (1.753 m)   Wt 83.8 kg   SpO2 100%   BMI 27.28 kg/m  Physical Exam Vitals and nursing note reviewed.  Constitutional:      General: He is not in acute distress.    Appearance: He is well-developed. He is ill-appearing. He is not toxic-appearing or diaphoretic.  HENT:     Head: Normocephalic and atraumatic.     Nose: Nose normal.     Mouth/Throat:     Mouth: Mucous membranes are moist.     Pharynx: No oropharyngeal exudate or posterior oropharyngeal erythema.  Eyes:     General: Scleral icterus present.     Extraocular Movements: Extraocular movements intact.     Conjunctiva/sclera: Conjunctivae normal.     Pupils: Pupils are equal, round, and reactive to light.  Cardiovascular:     Rate and Rhythm: Normal rate and regular rhythm.  Heart sounds: Murmur heard.  Pulmonary:     Effort: Pulmonary effort is normal. No respiratory distress.     Breath sounds: Rales present. No wheezing or rhonchi.  Chest:     Chest wall: No tenderness.  Abdominal:     General: There is distension.     Palpations: Abdomen is soft.     Tenderness: There is no abdominal tenderness. There is no right CVA tenderness, left CVA tenderness, guarding or rebound.  Musculoskeletal:     Cervical back: Neck supple. No tenderness.     Right lower leg: Edema present.     Left lower leg: Edema present.  Skin:     General: Skin is warm and dry.     Capillary Refill: Capillary refill takes less than 2 seconds.     Coloration: Skin is jaundiced.     Findings: No erythema.  Neurological:     General: No focal deficit present.     Mental Status: He is alert.     Sensory: No sensory deficit.     Motor: No weakness.  Psychiatric:        Mood and Affect: Mood normal.     ED Results / Procedures / Treatments   Labs (all labs ordered are listed, but only abnormal results are displayed) Labs Reviewed  CBC - Abnormal; Notable for the following components:      Result Value   WBC 18.0 (*)    RBC 1.62 (*)    Hemoglobin 6.3 (*)    HCT 19.1 (*)    MCV 117.9 (*)    MCH 38.9 (*)    RDW 20.5 (*)    Platelets 56 (*)    All other components within normal limits  BRAIN NATRIURETIC PEPTIDE - Abnormal; Notable for the following components:   B Natriuretic Peptide 848.5 (*)    All other components within normal limits  AMMONIA - Abnormal; Notable for the following components:   Ammonia 72 (*)    All other components within normal limits  COMPREHENSIVE METABOLIC PANEL - Abnormal; Notable for the following components:   Sodium 130 (*)    Potassium 2.5 (*)    Chloride 95 (*)    Glucose, Bld 101 (*)    Creatinine, Ser 1.51 (*)    Calcium 8.7 (*)    Albumin 2.7 (*)    AST 122 (*)    ALT 58 (*)    Total Bilirubin 33.9 (*)    GFR, Estimated 58 (*)    All other components within normal limits  PROTIME-INR - Abnormal; Notable for the following components:   Prothrombin Time 34.4 (*)    INR 3.4 (*)    All other components within normal limits  URINALYSIS, ROUTINE W REFLEX MICROSCOPIC - Abnormal; Notable for the following components:   Color, Urine ORANGE (*)    APPearance HAZY (*)    Glucose, UA 100 (*)    Hgb urine dipstick MODERATE (*)    Bilirubin Urine LARGE (*)    Protein, ur 30 (*)    All other components within normal limits  OCCULT BLOOD X 1 CARD TO LAB, STOOL - Abnormal; Notable for the  following components:   Fecal Occult Bld POSITIVE (*)    All other components within normal limits  URINALYSIS, MICROSCOPIC (REFLEX) - Abnormal; Notable for the following components:   Bacteria, UA RARE (*)    All other components within normal limits  TROPONIN I (HIGH SENSITIVITY) - Abnormal; Notable for the following components:  Troponin I (High Sensitivity) 50 (*)    All other components within normal limits  URINE CULTURE  MRSA NEXT GEN BY PCR, NASAL  MAGNESIUM  TROPONIN I (HIGH SENSITIVITY)    EKG EKG Interpretation  Date/Time:  Thursday May 16 2022 14:53:54 EDT Ventricular Rate:  92 PR Interval:  152 QRS Duration: 96 QT Interval:  496 QTC Calculation: 613 R Axis:   43 Text Interpretation: Critical Test Result: Long QTc Normal sinus rhythm Nonspecific ST abnormality Prolonged QT Abnormal ECG When compared with ECG of 29-Nov-2008 20:45, PREVIOUS ECG IS PRESENT when comapred to prior, longer QTc. No STEMI Confirmed by Antony Blackbird [phone]) on 05/16/2022 3:52:18 PM  Radiology DG Chest 2 View  Result Date: 05/16/2022 CLINICAL DATA:  Lower extremity edema EXAM: CHEST - 2 VIEW COMPARISON:  10/31/2020 FINDINGS: Frontal and lateral views of the chest demonstrate a stable cardiac silhouette. There is increased central vascular congestion with patchy perihilar airspace disease greatest in the right infrahilar region. No effusion or pneumothorax. No acute bony abnormalities. IMPRESSION: 1. Central vascular congestion and mild pulmonary edema. Electronically Signed   By: Randa Ngo M.D.   On: 05/16/2022 15:37    Procedures Procedures    CRITICAL CARE Performed by: Gwenyth Allegra Ruben Pyka Total critical care time: 40 minutes Critical care time was exclusive of separately billable procedures and treating other patients. Critical care was necessary to treat or prevent imminent or life-threatening deterioration. Critical care was time spent personally by me on the following  activities: development of treatment plan with patient and/or surrogate as well as nursing, discussions with consultants, evaluation of patient's response to treatment, examination of patient, obtaining history from patient or surrogate, ordering and performing treatments and interventions, ordering and review of laboratory studies, ordering and review of radiographic studies, pulse oximetry and re-evaluation of patient's condition.   Medications Ordered in ED Medications  Chlorhexidine Gluconate Cloth 2 % PADS 6 each (has no administration in time range)  Oral care mouth rinse (has no administration in time range)  Oral care mouth rinse (has no administration in time range)  potassium chloride 10 mEq in 100 mL IVPB (10 mEq Intravenous New Bag/Given 05/16/22 1848)    ED Course/ Medical Decision Making/ A&P                           Medical Decision Making Amount and/or Complexity of Data Reviewed Labs: ordered. Radiology: ordered.  Risk OTC drugs. Prescription drug management. Decision regarding hospitalization.    Braxtyn Gosey is a 46 y.o. male with a past medical history significant for previous alcohol abuse with subsequent cirrhosis who presents with worsening fatigue, diffuse edema, jaundice, intermittent confusion, and some shortness of breath.  Patient reports that he quit drinking approximately 3 weeks ago and has not had any more alcohol.  He has not been on any Lasix recently for his edema and is concerned that everything is gradually worsening.  He is feeling more fatigued and tired and feeling slightly confused at times.  He is having some intermittent shortness of breath but denies chest pain or palpitations.  Denies nausea, vomiting, chest pain, or abdominal pain.  Denies any fevers or chills.  Denies any injuries.  Denies any constipation or diarrhea.  He denies any new leg pain but they feel very heavy with the worsening edema.  On exam, patient extremely jaundiced.  He  has scleral icterus and jaundice in his mouth and skin.  Lungs had  a loud murmur and had rales throughout his lungs.  Abdomen was nontender but was distended with good bowel sounds.  Legs were very edematous but he had intact sensation and strength.  No focal neurologic deficits initially.  Pupils are symmetric and reactive with normal extract movements.  Chart review shows that patient has been seen by GI with [facility] most recently in August.  It was recommending abstinence from alcohol and follow-up in clinic.  It does not appear he has seen them since then.  His assessment was that he was having decompensated alcoholic cirrhosis with ascites with poor prognosis as well as lower extremity edema felt to be a consequence of advanced portal hypertension.  Given the patient's worsening fatigue, some shortness of breath, and edema, we will get repeat labs to look for worsening liver failure or other multisystem organ failure.  In triage she had a chest x-ray ordered that revealed central vascular congestion and pulmonary edema which fits with the exam.  We will wait for labs including BNP, CMP, ammonia, and troponin.  INR elevated at 3.4.  On 8/9 it was found to be 1.91 with [facility].  Hemoglobin just returned at 6.3 down from 9.0 on 7/26.  Anticipate discussion with gastroenterology and likely admission after work-up is completed.  6:25 PM Work-up continues to return.  Troponin elevated at 50, will trend.  Ammonia elevated at 72.  Fecal occult is positive although he denies any rectal bleeding.  Given the downtrending hemoglobin, and elevated white blood cell count, will discuss with the GI team at [facility] if they would like antibiotics as well.  BNP elevated 848.  Still awaiting second callback from [facility] team to discuss transfer and admission.  7:13 PM Spoke to family who would prefer for patient to go to [facility] if possible.  I will call the consulting  GI team in the [facility] system and see if they are amenable to admission to medicine for further management.  7:55 PM Just spoke to Dr. Watt Climes with [facility] gastroenterology and he does feel that the patient is appropriate for being managed in the [facility] system at either [facility] or [facility] and he will have his team see them in the morning.  He requested an epic message from the admitting hospitalist team when he gets there to get seen.  They said that since he was not acutely hemorrhaging with abdominal pain or bright rectal bleeding they did not think the antibiotics are needed at this time.  We will call medicine for admission.  Medicine agrees with admission.  They will admit to [facility].  They request a magnesium level which we will order.  Anticipate receiving blood once he arrives to his bed and consultation with GI in the morning.         Final Clinical Impression(s) / ED Diagnoses Final diagnoses:  Gastrointestinal hemorrhage, unspecified gastrointestinal hemorrhage type  Liver failure without hepatic coma, unspecified chronicity (HCC)  AKI (acute kidney injury) (HCC)  Elevated brain natriuretic peptide (BNP) level  Shortness of breath    Clinical Impression: 1. Gastrointestinal hemorrhage, unspecified gastrointestinal hemorrhage type   2. Liver failure without hepatic coma, unspecified chronicity ([place])   3. AKI (acute kidney injury) ([place])   4. Elevated brain natriuretic peptide (BNP) level   5. Shortness of breath     Disposition: Admit  This note was prepared with assistance of Dragon voice recognition software. Occasional wrong-word or sound-a-like substitutions may have  occurred due to the inherent limitations of voice recognition software.      Nishant Schrecengost, Gwenyth Allegra, MD 05/16/22 2224

## 2022-05-16 NOTE — Plan of Care (Signed)
[facility] will assume care on arrival to accepting facility. Until arrival, care as per EDP. However, [facility] available 24/7 for questions and assistance.  Nursing staff, please page [facility] Admits and Consults ([phone]) as soon as the patient arrives to the hospital.   

## 2022-05-17 ENCOUNTER — Inpatient Hospital Stay (HOSPITAL_COMMUNITY): Payer: 59

## 2022-05-17 DIAGNOSIS — K7031 Alcoholic cirrhosis of liver with ascites: Secondary | ICD-10-CM | POA: Diagnosis present

## 2022-05-17 DIAGNOSIS — K7040 Alcoholic hepatic failure without coma: Secondary | ICD-10-CM | POA: Diagnosis present

## 2022-05-17 DIAGNOSIS — K766 Portal hypertension: Secondary | ICD-10-CM | POA: Diagnosis present

## 2022-05-17 DIAGNOSIS — D62 Acute posthemorrhagic anemia: Secondary | ICD-10-CM | POA: Diagnosis present

## 2022-05-17 DIAGNOSIS — I340 Nonrheumatic mitral (valve) insufficiency: Secondary | ICD-10-CM | POA: Diagnosis not present

## 2022-05-17 DIAGNOSIS — F17210 Nicotine dependence, cigarettes, uncomplicated: Secondary | ICD-10-CM | POA: Diagnosis present

## 2022-05-17 DIAGNOSIS — E8770 Fluid overload, unspecified: Secondary | ICD-10-CM | POA: Diagnosis present

## 2022-05-17 DIAGNOSIS — E871 Hypo-osmolality and hyponatremia: Secondary | ICD-10-CM | POA: Diagnosis present

## 2022-05-17 DIAGNOSIS — N179 Acute kidney failure, unspecified: Secondary | ICD-10-CM | POA: Diagnosis not present

## 2022-05-17 DIAGNOSIS — K7200 Acute and subacute hepatic failure without coma: Secondary | ICD-10-CM | POA: Diagnosis not present

## 2022-05-17 DIAGNOSIS — K7290 Hepatic failure, unspecified without coma: Secondary | ICD-10-CM | POA: Diagnosis not present

## 2022-05-17 DIAGNOSIS — E8809 Other disorders of plasma-protein metabolism, not elsewhere classified: Secondary | ICD-10-CM | POA: Diagnosis present

## 2022-05-17 DIAGNOSIS — I351 Nonrheumatic aortic (valve) insufficiency: Secondary | ICD-10-CM

## 2022-05-17 DIAGNOSIS — I361 Nonrheumatic tricuspid (valve) insufficiency: Secondary | ICD-10-CM

## 2022-05-17 DIAGNOSIS — Z79899 Other long term (current) drug therapy: Secondary | ICD-10-CM | POA: Diagnosis not present

## 2022-05-17 DIAGNOSIS — J810 Acute pulmonary edema: Secondary | ICD-10-CM | POA: Diagnosis present

## 2022-05-17 DIAGNOSIS — K922 Gastrointestinal hemorrhage, unspecified: Secondary | ICD-10-CM | POA: Diagnosis not present

## 2022-05-17 DIAGNOSIS — R0602 Shortness of breath: Secondary | ICD-10-CM | POA: Diagnosis present

## 2022-05-17 DIAGNOSIS — D638 Anemia in other chronic diseases classified elsewhere: Secondary | ICD-10-CM | POA: Diagnosis present

## 2022-05-17 DIAGNOSIS — R9431 Abnormal electrocardiogram [ECG] [EKG]: Secondary | ICD-10-CM | POA: Diagnosis present

## 2022-05-17 DIAGNOSIS — D6959 Other secondary thrombocytopenia: Secondary | ICD-10-CM | POA: Diagnosis present

## 2022-05-17 DIAGNOSIS — E876 Hypokalemia: Secondary | ICD-10-CM | POA: Diagnosis present

## 2022-05-17 DIAGNOSIS — K3189 Other diseases of stomach and duodenum: Secondary | ICD-10-CM | POA: Diagnosis present

## 2022-05-17 DIAGNOSIS — K7682 Hepatic encephalopathy: Secondary | ICD-10-CM | POA: Diagnosis present

## 2022-05-17 DIAGNOSIS — R5381 Other malaise: Secondary | ICD-10-CM | POA: Diagnosis present

## 2022-05-17 DIAGNOSIS — D684 Acquired coagulation factor deficiency: Secondary | ICD-10-CM | POA: Diagnosis present

## 2022-05-17 DIAGNOSIS — F1010 Alcohol abuse, uncomplicated: Secondary | ICD-10-CM | POA: Diagnosis present

## 2022-05-17 DIAGNOSIS — K7011 Alcoholic hepatitis with ascites: Secondary | ICD-10-CM | POA: Diagnosis present

## 2022-05-17 LAB — COMPREHENSIVE METABOLIC PANEL
ALT: 55 U/L — ABNORMAL HIGH (ref 0–44)
ALT: 52 U/L — ABNORMAL HIGH (ref 0–44)
AST: 110 U/L — ABNORMAL HIGH (ref 15–41)
AST: 96 U/L — ABNORMAL HIGH (ref 15–41)
Albumin: 1.8 g/dL — ABNORMAL LOW (ref 3.5–5.0)
Albumin: 1.9 g/dL — ABNORMAL LOW (ref 3.5–5.0)
Alkaline Phosphatase: 69 U/L (ref 38–126)
Alkaline Phosphatase: 57 U/L (ref 38–126)
Anion gap: 8 (ref 5–15)
Anion gap: 6 (ref 5–15)
BUN: 12 mg/dL (ref 6–20)
BUN: 12 mg/dL (ref 6–20)
CO2: 23 mmol/L (ref 22–32)
CO2: 24 mmol/L (ref 22–32)
Calcium: 8.1 mg/dL — ABNORMAL LOW (ref 8.9–10.3)
Calcium: 8.2 mg/dL — ABNORMAL LOW (ref 8.9–10.3)
Chloride: 99 mmol/L (ref 98–111)
Chloride: 102 mmol/L (ref 98–111)
Creatinine, Ser: 0.97 mg/dL (ref 0.61–1.24)
Creatinine, Ser: 0.98 mg/dL (ref 0.61–1.24)
GFR, Estimated: >60 mL/min (ref >60)
GFR, Estimated: >60 mL/min (ref >60)
Glucose, Bld: 96 mg/dL (ref 70–99)
Glucose, Bld: 102 mg/dL — ABNORMAL HIGH (ref 70–99)
Potassium: 2.5 mmol/L — CL (ref 3.5–5.1)
Potassium: 3 mmol/L — ABNORMAL LOW (ref 3.5–5.1)
Sodium: 130 mmol/L — ABNORMAL LOW (ref 135–145)
Sodium: 132 mmol/L — ABNORMAL LOW (ref 135–145)
Total Bilirubin: 27.2 mg/dL (ref 0.3–1.2)
Total Bilirubin: 26.5 mg/dL (ref 0.3–1.2)
Total Protein: 7.3 g/dL (ref 6.5–8.1)
Total Protein: 6.9 g/dL (ref 6.5–8.1)

## 2022-05-17 LAB — CBC WITH DIFFERENTIAL/PLATELET
Abs Immature Granulocytes: 0.3 10*3/uL — ABNORMAL HIGH (ref 0.00–0.07)
Abs Immature Granulocytes: 0.32 10*3/uL — ABNORMAL HIGH (ref 0.00–0.07)
Basophils Absolute: 0 10*3/uL (ref 0.0–0.1)
Basophils Absolute: 0 10*3/uL (ref 0.0–0.1)
Basophils Relative: 0 %
Basophils Relative: 0 %
Eosinophils Absolute: 0.1 10*3/uL (ref 0.0–0.5)
Eosinophils Absolute: 0.1 10*3/uL (ref 0.0–0.5)
Eosinophils Relative: 1 %
Eosinophils Relative: 1 %
HCT: 18.2 % — ABNORMAL LOW (ref 39.0–52.0)
HCT: 19.5 % — ABNORMAL LOW (ref 39.0–52.0)
Hemoglobin: 6.1 g/dL — CL (ref 13.0–17.0)
Hemoglobin: 6.6 g/dL — CL (ref 13.0–17.0)
Immature Granulocytes: 2 %
Immature Granulocytes: 3 %
Lymphocytes Relative: 10 %
Lymphocytes Relative: 8 %
Lymphs Abs: 1.4 10*3/uL (ref 0.7–4.0)
Lymphs Abs: 1 10*3/uL (ref 0.7–4.0)
MCH: 40.4 pg — ABNORMAL HIGH (ref 26.0–34.0)
MCH: 38.6 pg — ABNORMAL HIGH (ref 26.0–34.0)
MCHC: 33.5 g/dL (ref 30.0–36.0)
MCHC: 33.8 g/dL (ref 30.0–36.0)
MCV: 120.5 fL — ABNORMAL HIGH (ref 80.0–100.0)
MCV: 114 fL — ABNORMAL HIGH (ref 80.0–100.0)
Monocytes Absolute: 1.4 10*3/uL — ABNORMAL HIGH (ref 0.1–1.0)
Monocytes Absolute: 1 10*3/uL (ref 0.1–1.0)
Monocytes Relative: 10 %
Monocytes Relative: 8 %
Neutro Abs: 11.5 10*3/uL — ABNORMAL HIGH (ref 1.7–7.7)
Neutro Abs: 9.7 10*3/uL — ABNORMAL HIGH (ref 1.7–7.7)
Neutrophils Relative %: 77 %
Neutrophils Relative %: 80 %
Platelets: 51 10*3/uL — ABNORMAL LOW (ref 150–400)
Platelets: 50 10*3/uL — ABNORMAL LOW (ref 150–400)
RBC: 1.51 MIL/uL — ABNORMAL LOW (ref 4.22–5.81)
RBC: 1.71 MIL/uL — ABNORMAL LOW (ref 4.22–5.81)
RDW: 20.9 % — ABNORMAL HIGH (ref 11.5–15.5)
RDW: 22.6 % — ABNORMAL HIGH (ref 11.5–15.5)
Smear Review: DECREASED
WBC: 14.8 10*3/uL — ABNORMAL HIGH (ref 4.0–10.5)
WBC: 12.2 10*3/uL — ABNORMAL HIGH (ref 4.0–10.5)
nRBC: 0.2 % (ref 0.0–0.2)
nRBC: 0.2 % (ref 0.0–0.2)

## 2022-05-17 LAB — CBC
HCT: 19.1 % — ABNORMAL LOW (ref 39.0–52.0)
Hemoglobin: 6.3 g/dL — CL (ref 13.0–17.0)
MCH: 38.9 pg — ABNORMAL HIGH (ref 26.0–34.0)
MCHC: 33 g/dL (ref 30.0–36.0)
MCV: 117.9 fL — ABNORMAL HIGH (ref 80.0–100.0)
Platelets: 56 10*3/uL — ABNORMAL LOW (ref 150–400)
RBC: 1.62 MIL/uL — ABNORMAL LOW (ref 4.22–5.81)
RDW: 20.5 % — ABNORMAL HIGH (ref 11.5–15.5)
WBC: 18 10*3/uL — ABNORMAL HIGH (ref 4.0–10.5)
nRBC: 0.1 % (ref 0.0–0.2)

## 2022-05-17 LAB — ECHOCARDIOGRAM COMPLETE
AR max vel: 3.23 cm2
AV Area VTI: 3.05 cm2
AV Area mean vel: 2.96 cm2
AV Mean grad: 8 mmHg
AV Peak grad: 14.9 mmHg
Ao pk vel: 1.93 m/s
Area-P 1/2: 3.53 cm2
Calc EF: 69.7 %
Height: 68 in
MV M vel: 4.35 m/s
MV Peak grad: 75.7 mmHg
MV VTI: 2.84 cm2
P 1/2 time: 280 msec
Radius: 0.5 cm
S' Lateral: 3.8 cm
Single Plane A2C EF: 66.1 %
Single Plane A4C EF: 74.8 %
Weight: 2881.85 oz

## 2022-05-17 LAB — BODY FLUID CELL COUNT WITH DIFFERENTIAL
Lymphs, Fluid: 12 %
Monocyte-Macrophage-Serous Fluid: 41 % — ABNORMAL LOW (ref 50–90)
Neutrophil Count, Fluid: 47 % — ABNORMAL HIGH (ref 0–25)
Total Nucleated Cell Count, Fluid: 234 cu mm (ref 0–1000)

## 2022-05-17 LAB — MRSA NEXT GEN BY PCR, NASAL: MRSA by PCR Next Gen: NOT DETECTED

## 2022-05-17 LAB — HEMOGLOBIN AND HEMATOCRIT, BLOOD
HCT: 21.9 % — ABNORMAL LOW (ref 39.0–52.0)
Hemoglobin: 7.5 g/dL — ABNORMAL LOW (ref 13.0–17.0)

## 2022-05-17 LAB — PREPARE RBC (CROSSMATCH)

## 2022-05-17 LAB — ALBUMIN, PLEURAL OR PERITONEAL FLUID: Albumin, Fluid: <1.5 g/dL

## 2022-05-17 LAB — GLUCOSE, PLEURAL OR PERITONEAL FLUID: Glucose, Fluid: 137 mg/dL

## 2022-05-17 LAB — LACTATE DEHYDROGENASE, PLEURAL OR PERITONEAL FLUID: LD, Fluid: 69 U/L — ABNORMAL HIGH (ref 3–23)

## 2022-05-17 LAB — AMMONIA: Ammonia: 44 umol/L — ABNORMAL HIGH (ref 9–35)

## 2022-05-17 LAB — HIV ANTIBODY (ROUTINE TESTING W REFLEX): HIV Screen 4th Generation wRfx: NONREACTIVE

## 2022-05-17 LAB — PHOSPHORUS: Phosphorus: 3.5 mg/dL (ref 2.5–4.6)

## 2022-05-17 LAB — MAGNESIUM: Magnesium: 2.3 mg/dL (ref 1.7–2.4)

## 2022-05-17 MED ORDER — SODIUM CHLORIDE 0.9% IV SOLUTION: Freq: Once | INTRAVENOUS | Status: AC

## 2022-05-17 MED ORDER — LACTULOSE 10 GM/15ML PO SOLN
10.0000 g | Freq: Three times a day (TID) | ORAL | Status: DC
  Administered 2022-05-17 – 2022-05-21 (×14): 10 g via ORAL
  Filled 2022-05-17 (×14): qty 15

## 2022-05-17 MED ORDER — CHLORHEXIDINE GLUCONATE CLOTH 2 % EX PADS
6.0000 | MEDICATED_PAD | Freq: Every day | CUTANEOUS | Status: DC
  Administered 2022-05-17 – 2022-05-19 (×3): 6 via TOPICAL

## 2022-05-17 MED ORDER — LIDOCAINE HCL 1 % IJ SOLN
INTRAMUSCULAR | Status: AC
  Administered 2022-05-17: 10 mL
  Filled 2022-05-17: qty 20

## 2022-05-17 MED ORDER — SPIRONOLACTONE 25 MG PO TABS
25.0000 mg | ORAL_TABLET | Freq: Every day | ORAL | Status: DC
  Administered 2022-05-17 – 2022-05-21 (×5): 25 mg via ORAL
  Filled 2022-05-17 (×5): qty 1

## 2022-05-17 MED ORDER — PREDNISOLONE 5 MG PO TABS
40.0000 mg | ORAL_TABLET | Freq: Every day | ORAL | Status: DC
  Administered 2022-05-17 – 2022-05-21 (×5): 40 mg via ORAL
  Filled 2022-05-17 (×5): qty 8

## 2022-05-17 MED ORDER — FUROSEMIDE 10 MG/ML IJ SOLN
40.0000 mg | Freq: Two times a day (BID) | INTRAMUSCULAR | Status: DC
  Administered 2022-05-17 – 2022-05-19 (×5): 40 mg via INTRAVENOUS
  Filled 2022-05-17 (×5): qty 4

## 2022-05-17 MED ORDER — POTASSIUM CHLORIDE 10 MEQ/100ML IV SOLN
10.0000 meq | INTRAVENOUS | Status: AC
  Administered 2022-05-17 (×3): 10 meq via INTRAVENOUS
  Filled 2022-05-17 (×3): qty 100

## 2022-05-17 MED ORDER — SODIUM CHLORIDE 0.9 % IV SOLN
2.0000 g | INTRAVENOUS | Status: DC
  Administered 2022-05-17 – 2022-05-20 (×4): 2 g via INTRAVENOUS
  Filled 2022-05-17 (×4): qty 20

## 2022-05-17 MED ORDER — PROCHLORPERAZINE EDISYLATE 10 MG/2ML IJ SOLN: 5.0000 mg | Freq: Four times a day (QID) | INTRAMUSCULAR | Status: DC | PRN

## 2022-05-17 MED ORDER — PANTOPRAZOLE SODIUM 40 MG IV SOLR
40.0000 mg | Freq: Two times a day (BID) | INTRAVENOUS | Status: DC
  Administered 2022-05-17: 40 mg via INTRAVENOUS
  Filled 2022-05-17: qty 10

## 2022-05-17 MED ORDER — FUROSEMIDE 10 MG/ML IJ SOLN
20.0000 mg | Freq: Two times a day (BID) | INTRAMUSCULAR | Status: DC
  Administered 2022-05-17: 20 mg via INTRAVENOUS
  Filled 2022-05-17: qty 2

## 2022-05-17 MED ORDER — MELATONIN 5 MG PO TABS
5.0000 mg | ORAL_TABLET | Freq: Every evening | ORAL | Status: DC | PRN
  Administered 2022-05-17: 5 mg via ORAL
  Filled 2022-05-17: qty 1

## 2022-05-17 MED ORDER — FOLIC ACID 1 MG PO TABS
1.0000 mg | ORAL_TABLET | Freq: Every day | ORAL | Status: DC
  Administered 2022-05-17 – 2022-05-21 (×5): 1 mg via ORAL
  Filled 2022-05-17 (×5): qty 1

## 2022-05-17 MED ORDER — POTASSIUM CHLORIDE CRYS ER 20 MEQ PO TBCR
40.0000 meq | EXTENDED_RELEASE_TABLET | Freq: Two times a day (BID) | ORAL | Status: DC
  Administered 2022-05-17: 40 meq via ORAL
  Filled 2022-05-17: qty 2

## 2022-05-17 MED ORDER — PANTOPRAZOLE SODIUM 40 MG PO TBEC
40.0000 mg | DELAYED_RELEASE_TABLET | Freq: Two times a day (BID) | ORAL | Status: DC
  Administered 2022-05-17 – 2022-05-21 (×8): 40 mg via ORAL
  Filled 2022-05-17 (×8): qty 1

## 2022-05-17 MED ORDER — POTASSIUM CHLORIDE CRYS ER 20 MEQ PO TBCR
40.0000 meq | EXTENDED_RELEASE_TABLET | ORAL | Status: AC
  Administered 2022-05-17: 40 meq via ORAL
  Filled 2022-05-17: qty 2

## 2022-05-17 MED ORDER — POLYETHYLENE GLYCOL 3350 17 G PO PACK: 17.0000 g | PACK | Freq: Every day | ORAL | Status: DC | PRN

## 2022-05-17 MED ORDER — POTASSIUM CHLORIDE CRYS ER 20 MEQ PO TBCR
40.0000 meq | EXTENDED_RELEASE_TABLET | Freq: Three times a day (TID) | ORAL | Status: AC
  Administered 2022-05-17 – 2022-05-19 (×6): 40 meq via ORAL
  Filled 2022-05-17 (×6): qty 2

## 2022-05-17 MED ORDER — ALBUMIN HUMAN 25 % IV SOLN
12.5000 g | Freq: Once | INTRAVENOUS | Status: AC
  Administered 2022-05-17: 12.5 g via INTRAVENOUS
  Filled 2022-05-17: qty 50

## 2022-05-17 MED ORDER — FUROSEMIDE 10 MG/ML IJ SOLN
20.0000 mg | Freq: Every day | INTRAMUSCULAR | Status: DC
  Administered 2022-05-17: 20 mg via INTRAVENOUS
  Filled 2022-05-17: qty 2

## 2022-05-17 MED ORDER — THIAMINE HCL 100 MG PO TABS
100.0000 mg | ORAL_TABLET | Freq: Every day | ORAL | Status: DC
  Administered 2022-05-17 – 2022-05-21 (×5): 100 mg via ORAL
  Filled 2022-05-17 (×10): qty 1

## 2022-05-17 MED ORDER — POTASSIUM CHLORIDE CRYS ER 20 MEQ PO TBCR
40.0000 meq | EXTENDED_RELEASE_TABLET | Freq: Once | ORAL | Status: AC
  Administered 2022-05-17: 40 meq via ORAL
  Filled 2022-05-17: qty 2

## 2022-05-17 NOTE — Progress Notes (Signed)
PT Cancellation Note  Patient Details Name: Seth Holmes MRN: [id number] DOB: Dec 07, 1975   Cancelled Treatment:     PT order received but eval deferred this date - pt hgb 6.6 with blood transfusion in progress this pm.  Will follow.   [name] 05/17/2022, 4:30 PM

## 2022-05-17 NOTE — Progress Notes (Signed)
  Echocardiogram 2D Echocardiogram has been performed.  Seth Holmes 05/17/2022, 9:18 AM

## 2022-05-17 NOTE — Procedures (Signed)
PROCEDURE SUMMARY:  Successful US guided paracentesis from right lower quadrant.  Yielded 0.7L of yellow fluid.  No immediate complications.  Pt tolerated well.   Specimen was sent for labs.  EBL < 53mL  Mohab Ashby PA-C 05/17/2022 1:04 PM

## 2022-05-17 NOTE — Consult Note (Addendum)
Referring Provider: Cherene Altes, MD Primary Care Physician:  Center, [facility] Primary Gastroenterologist:  Althia Forts  Reason for Consultation:  Decompensated cirrhosis, acute blood loss anemia  HPI: Seth Holmes is a 46 y.o. male with medical history significant for alcohol abuse, alcoholic cirrhosis with ascites (no prior paracentesis) followed at [place], chronic macrocytic anemia, history of colon polyps, who initially presented to [facility] ED with complaints of legs swelling despite taking home Lasix.  Work-up in the ED revealed volume overload with hypoalbuminemia, decompensated liver cirrhosis with ascites, coagulopathy in the setting of cirrhosis, acute blood loss anemia, anemia of chronic disease, jaundice, elevated BNP, elevated high-sensitivity troponin, with concern for acute CHF.   Per outside medical records, CT scan 12/13/21 revealed: Cirrhotic liver with markedly dilated umbilical vein, numerous varices. Distended gallbladder with multiple layering gallstones, mild gallbladder wall thickening, normal pancreas, gastric wall and esophageal thickening, small hiatal hernia, portacaval/mesenteric and periaortic lymphadenopathy was noted.    Labs on presentation significant for hemoglobin 6.3 (9.2 on 10/2020), WBC 18, platelet count 56, sodium 138, potassium 2.5, creatinine 1.51, AST 122, ALT 58, alk phos 79, T. bili 33.9.  Ammonia 72.  INR 3.4.  Albumin 1.8. Positive FOBT.  Patient noted to be jaundiced.  Has been started on IV Lasix 20 mg daily, spironolactone 25 mg daily.  Has been given 1 dose of albumin.  Lactulose.  1 unit PRBCs ordered. Started on IV Protonix.   IR consulted for possible paracentesis.  GI is consulted for decompensated cirrhosis with ascites and acute blood loss anemia.  Patient seen and examined at bedside this morning.  States that he had noticed swelling of his legs, feet, and abdomen.  He started taking "water pills "but they did not help so  he stopped taking them.  Last took about 3 weeks ago.  [facility], states he sees them every now and then.  History he has noticed yellowing of his eyes but states this is not new, has been ongoing.  Denies any nausea, vomiting, fever, chills.  Denies hematemesis or coffee-ground emesis.  Abdominal distention is uncomfortable but denies any abdominal pain.  Reports he has had regular bowel movements, though recently has had some constipation.  States he has been given laxatives here in the hospital.  Occasionally sees dark brown stools but denies any black stools, diarrhea, hematochezia.  Occasionally takes Pepto-Bismol if he feels he is constipated.  Denies confusion.  Responds appropriately to questions.  Alert and oriented to person place and situation.    States he has not had any alcohol in 3 weeks.  Smokes a pack of cigarettes every 4 days.  Denies MI/stroke history, not on blood thinners.  Denies family history of GI malignancy or disease.  Thinks he may have had an EGD in the past but is unsure.  No prior colonoscopy.   Past Medical History:  Diagnosis Date   Cirrhosis ([place])    H. pylori infection     Past Surgical History:  Procedure Laterality Date   APPENDECTOMY     COLONOSCOPY W/ BIOPSIES AND POLYPECTOMY      Prior to Admission medications   Medication Sig Start Date End Date Taking? Authorizing Provider  furosemide (LASIX) 20 MG tablet Take 1 tablet by mouth daily. 02/27/22 05/28/22 Yes [provider]  sucralfate (CARAFATE) 1 GM/10ML suspension Take by mouth. 12/16/20  Yes [provider]  folic acid (FOLVITE) 1 MG tablet Take 1 tablet (1 mg total) by  mouth daily. 11/04/20   Eugenie Filler, MD  pantoprazole (PROTONIX) 40 MG tablet Take 1 tablet (40 mg total) by mouth daily. 11/03/20   Eugenie Filler, MD  thiamine 100 MG tablet Take 1 tablet (100 mg total) by mouth daily. 11/04/20   Eugenie Filler, MD    Scheduled Meds:   Chlorhexidine Gluconate Cloth  6 each Topical Daily   folic acid  1 mg Oral Daily   furosemide  20 mg Intravenous Daily   lactulose  10 g Oral TID   mouth rinse  15 mL Mouth Rinse 4 times per day   pantoprazole (PROTONIX) IV  40 mg Intravenous Q12H   spironolactone  25 mg Oral Daily   thiamine  100 mg Oral Daily   Continuous Infusions:  cefTRIAXone (ROCEPHIN)  IV Stopped (05/17/22 0450)   PRN Meds:.melatonin, mouth rinse, polyethylene glycol, prochlorperazine  Allergies as of 05/16/2022   (No Known Allergies)    History reviewed. No pertinent family history.  Social History   Socioeconomic History   Marital status: Married    Spouse name: Not on file   Number of children: Not on file   Years of education: Not on file   Highest education level: Not on file  Occupational History   Not on file  Tobacco Use   Smoking status: Every Day    Packs/day: 0.50    Types: Cigarettes   Smokeless tobacco: Never  Substance and Sexual Activity   Alcohol use: Yes    Comment: reports none in 3 weeks   Drug use: No   Sexual activity: Not on file  Other Topics Concern   Not on file  Social History Narrative   Not on file   Social Determinants of Health   Financial Resource Strain: Not on file  Food Insecurity: No Food Insecurity (05/17/2022)   Hunger Vital Sign    Worried About Running Out of Food in the Last Year: Never true    Ran Out of Food in the Last Year: Never true  Transportation Needs: No Transportation Needs (05/17/2022)   PRAPARE - Hydrologist (Medical): No    Lack of Transportation (Non-Medical): No  Physical Activity: Not on file  Stress: Not on file  Social Connections: Not on file  Intimate Partner Violence: Not At Risk (05/17/2022)   Humiliation, Afraid, Rape, and Kick questionnaire    Fear of Current or Ex-Partner: No    Emotionally Abused: No    Physically Abused: No    Sexually Abused: No    Review of Systems: Review of  Systems  Constitutional:  Negative for chills and fever.  HENT:  Negative for sore throat.   Eyes:  Negative for discharge and redness.       Yellowing of eyes  Respiratory:  Negative for shortness of breath, wheezing and stridor.   Cardiovascular:  Positive for leg swelling. Negative for chest pain and palpitations.  Gastrointestinal:  Positive for constipation. Negative for abdominal pain, blood in stool, diarrhea, heartburn, melena, nausea and vomiting.  Genitourinary:  Negative for dysuria and urgency.  Musculoskeletal:  Negative for falls and joint pain.  Skin:  Negative for itching and rash.  Neurological:  Negative for seizures and loss of consciousness.  Endo/Heme/Allergies:  Negative for environmental allergies and polydipsia.  Psychiatric/Behavioral:  Negative for memory loss. The patient does not have insomnia.      Physical Exam: Physical Exam Vitals reviewed.  Constitutional:  General: He is not in acute distress.    Appearance: He is not diaphoretic.  HENT:     Head: Normocephalic and atraumatic.     Right Ear: External ear normal.     Left Ear: External ear normal.     Nose: Nose normal.     Mouth/Throat:     Mouth: Mucous membranes are moist.     Pharynx: Oropharynx is clear.  Eyes:     General: Scleral icterus present.     Extraocular Movements: Extraocular movements intact.  Cardiovascular:     Rate and Rhythm: Normal rate and regular rhythm.     Pulses: Normal pulses.     Heart sounds: Normal heart sounds.  Pulmonary:     Effort: Pulmonary effort is normal.     Breath sounds: Normal breath sounds.  Abdominal:     General: Bowel sounds are normal. There is distension.     Palpations: Abdomen is soft.     Tenderness: There is no abdominal tenderness. There is no guarding.  Musculoskeletal:     Cervical back: Normal range of motion and neck supple.     Right lower leg: Edema present.     Left lower leg: Edema present.  Skin:    General: Skin is warm  and dry.     Coloration: Skin is jaundiced.  Neurological:     General: No focal deficit present.     Mental Status: He is alert and oriented to person, place, and time.  Psychiatric:        Mood and Affect: Mood normal.        Behavior: Behavior normal.        Thought Content: Thought content normal.     Vital signs: Vitals:   05/17/22 0700 05/17/22 0800  BP: (!) 110/45 (!) 103/48  Pulse: 73   Resp: (!) 25 16  Temp:    SpO2: 99%    Last BM Date :  (PTA)    GI:  Lab Results: Recent Labs    05/16/22 1504 05/17/22 0136  WBC 18.0* 14.8*  HGB 6.3* 6.1*  HCT 19.1* 18.2*  PLT 56* 51*   BMET Recent Labs    05/16/22 1504 05/17/22 0136  NA 130* 130*  K 2.5* 2.5*  CL 95* 99  CO2 24 23  GLUCOSE 101* 96  BUN 13 12  CREATININE 1.51* 0.97  CALCIUM 8.7* 8.1*   LFT Recent Labs    05/17/22 0136  PROT 7.3  ALBUMIN 1.8*  AST 110*  ALT 55*  ALKPHOS 69  BILITOT 27.2*   PT/INR Recent Labs    05/16/22 1504  LABPROT 34.4*  INR 3.4*     Studies/Results: DG Chest 2 View  Result Date: 05/16/2022 CLINICAL DATA:  Lower extremity edema EXAM: CHEST - 2 VIEW COMPARISON:  10/31/2020 FINDINGS: Frontal and lateral views of the chest demonstrate a stable cardiac silhouette. There is increased central vascular congestion with patchy perihilar airspace disease greatest in the right infrahilar region. No effusion or pneumothorax. No acute bony abnormalities. IMPRESSION: 1. Central vascular congestion and mild pulmonary edema. Electronically Signed   By: Randa Ngo M.D.   On: 05/16/2022 15:37    Impression: Decompensated alcoholic cirrhosis, anemia - Per outside medical records, CT scan 12/13/21 revealed: Cirrhotic liver with markedly dilated umbilical vein, numerous varices. Distended gallbladder with multiple layering gallstones, mild gallbladder wall thickening, normal pancreas, gastric wall and esophageal thickening, small hiatal hernia, portacaval/mesenteric and  periaortic lymphadenopathy was noted.    -  Decompensated by ascites - MELD 3.0: 35 at 05/17/2022  9:21 AM MELD-Na: 34 at 05/17/2022  9:21 AM Calculated from: Serum Creatinine: 0.98 mg/dL (Using min of 1 mg/dL) at 05/17/2022  9:21 AM Serum Sodium: 132 mmol/L at 05/17/2022  9:21 AM Total Bilirubin: 26.5 mg/dL at 05/17/2022  9:21 AM Serum Albumin: 1.9 g/dL at 05/17/2022  9:21 AM INR(ratio): 3.4 at 05/16/2022  3:04 PM Age at listing (hypothetical): 22 years Sex: Male at 05/17/2022  9:21 AM   - Labs on presentation significant for hemoglobin 6.3 (9.0 on 02/2022 per outside records), WBC 18, platelet count 56, sodium 138, potassium 2.5, creatinine 1.51, AST 122, ALT 58, alk phos 79, T. bili 33.9.  Ammonia 72.  INR 3.4.  Albumin 1.8. Positive FOBT. - Labs 05/17/2022 - Potassium 3.0, sodium 132, albumin 1.9, AST 96, ALT 52, alk phos 57, T. bili 26.5, hemoglobin 6.6 - Has received 1 unit PRBCs - Improving leukocytosis, WBC 12.2 - Platelets 50 - Planning for IR paracentesis today  Prior endoscopic procedures per chart review: EGD 04/28/2017 - Slight darkening at the GE junction consistent with slightly prominent veins but no obvious discrete esophageal varices.  Moderate portal hypertensive gastropathy and gastritis. Mild duodenitis.  H. pylori positive on biopsy.  Colonoscopy 04/28/2017 - Two large 2 cm pedunculated polyps removed.  Medium internal hemorrhoids.  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  Plan: Patient with acute on chronic anemia, decompensated cirrhosis. Had FOBT positive stool, denies any melena or hematochezia. Planning for IR paracentesis today. Likely needs inpatient EGD, will discuss with Dr. Michail Sermon who will see patient later today. No overt GI bleeding currently,  per patient has not had a bowel movement in a few days. Continue Lasix, spironolactone, lactulose, Protonix. Will follow results of paracentesis.  Continue supportive care.  GI will follow.    LOS: 0 days   Angelique Holm  PA-C 05/17/2022, 8:19 AM  Contact #  [phone]

## 2022-05-17 NOTE — Progress Notes (Signed)
OT Cancellation Note  Patient Details Name: Seth Holmes MRN: [id number] DOB: May 21, 1976   Cancelled Treatment:    Reason Eval/Treat Not Completed: Medical issues which prohibited therapy. Patient presents with low HGB after receiving units. Will hold and evaluate when hemodynamically stable.  Seth Holmes 05/17/2022, 3:06 PM

## 2022-05-17 NOTE — H&P (Addendum)
History and Physical  Seth Holmes AYT:[id number] DOB: 09-18-1975 DOA: 05/16/2022  Referring physician: Accepted by Dr. Loney Loh, [facility], Hospitalist service.  PCP: [facility], [facility]  Outpatient Specialists: [facility], [facility]. Patient coming from: Home  Chief Complaint: Legs swelling.  HPI: Seth Holmes is a 46 y.o. male with medical history significant for alcohol abuse, alcoholic cirrhosis with ascites (no prior paracentesis) followed at [name], chronic macrocytic anemia, history of colon polyps, who initially presented to [name] with complaints of legs swelling despite taking home Lasix.  Work-up in the ED revealed volume overload with hypoalbuminemia, decompensated liver cirrhosis with ascites, coagulopathy in the setting of cirrhosis, acute blood loss anemia, anemia of chronic disease, jaundice, elevated BNP, elevated high-sensitivity troponin, with concern for acute CHF.  EDP requested admission from [facility].  The patient was admitted by Dr. Caprice Kluver, [name], hospitalist service, and transferred to [name] long hospital stepdown unit for further management.  To note, per outside medical records, CT scan 12/13/21 revealed: Cirrhotic liver with markedly dilated umbilical vein, numerous varices. Distended gallbladder with multiple layering gallstones, mild gallbladder wall thickening, normal pancreas, gastric wall and esophageal thickening, small hiatal hernia, portacaval/mesenteric and periaortic lymphadenopathy was noted.   ED Course: Tmax 98.4.  BP 115/44 with MAP of 67, pulse 73, respiration rate 22, O2 saturation 100% on room air.  Review of Systems: Review of systems as noted in the HPI. All other systems reviewed and are negative.   Past Medical History:  Diagnosis Date   Cirrhosis ([facility])    H. pylori infection    Past Surgical History:  Procedure Laterality Date   APPENDECTOMY     COLONOSCOPY W/ BIOPSIES AND POLYPECTOMY      Social History:  reports that he has been  smoking cigarettes. He has been smoking an average of .5 packs per day. He has never used smokeless tobacco. He reports current alcohol use. He reports that he does not use drugs.   No Known Allergies  Family history: None reported  Prior to Admission medications   Medication Sig Start Date End Date Taking? Authorizing Provider  furosemide (LASIX) 20 MG tablet Take 1 tablet by mouth daily. 02/27/22 05/28/22 Yes [provider]  sucralfate (CARAFATE) 1 GM/10ML suspension Take by mouth. 12/16/20  Yes [provider]  folic acid (FOLVITE) 1 MG tablet Take 1 tablet (1 mg total) by mouth daily. 11/04/20   Rodolph Bong, MD  pantoprazole (PROTONIX) 40 MG tablet Take 1 tablet (40 mg total) by mouth daily. 11/03/20   Rodolph Bong, MD  thiamine 100 MG tablet Take 1 tablet (100 mg total) by mouth daily. 11/04/20   Rodolph Bong, MD    Physical Exam: BP (!) 115/44   Pulse 73   Temp 98.3 F (36.8 C) (Oral)   Resp (!) 22   Ht 5\' 8"  (1.727 m)   Wt 81.7 kg   SpO2 100%   BMI 27.39 kg/m   General: 46 y.o. year-old male well developed well nourished in no acute distress.  Alert and oriented x3. Cardiovascular: Regular rate and rhythm with no rubs or gallops.  No thyromegaly or JVD noted.  2+ pitting edema lower extremities bilaterally Respiratory: Clear to auscultation with no wheezes or rales. Good inspiratory effort. Abdomen: Extended.  Nontender.  Bowel sounds present. Muskuloskeletal: No cyanosis or clubbing.  2+ pitting edema lower extremities bilaterally. Neuro: CN II-XII intact, strength, sensation, reflexes Skin: No ulcerative lesions noted or rashes Psychiatry: Judgement and insight appear normal.  Mood is appropriate for condition and setting          Labs on Admission:  Basic Metabolic Panel: Recent Labs  Lab 05/16/22 1504 05/16/22 2129 05/17/22 0136  NA 130*  --  130*  K 2.5*  --  2.5*  CL 95*  --  99  CO2 24  --  23  GLUCOSE 101*  --  96  BUN  13  --  12  CREATININE 1.51*  --  0.97  CALCIUM 8.7*  --  8.1*  MG  --  1.9 2.3  PHOS  --   --  3.5   Liver Function Tests: Recent Labs  Lab 05/16/22 1504 05/17/22 0136  AST 122* 110*  ALT 58* 55*  ALKPHOS 79 69  BILITOT 33.9* 27.2*  PROT 7.7 7.3  ALBUMIN 2.7* 1.8*   No results for input(s): "LIPASE", "AMYLASE" in the last 168 hours. Recent Labs  Lab 05/16/22 1504 05/17/22 0259  AMMONIA 72* 44*   CBC: Recent Labs  Lab 05/16/22 1504 05/17/22 0136  WBC 18.0* 14.8*  NEUTROABS  --  11.5*  HGB 6.3* 6.1*  HCT 19.1* 18.2*  MCV 117.9* 120.5*  PLT 56* 51*   Cardiac Enzymes: No results for input(s): "CKTOTAL", "CKMB", "CKMBINDEX", "TROPONINI" in the last 168 hours.  BNP (last 3 results) Recent Labs    05/16/22 1504  BNP 848.5*    ProBNP (last 3 results) No results for input(s): "PROBNP" in the last 8760 hours.  CBG: No results for input(s): "GLUCAP" in the last 168 hours.  Radiological Exams on Admission: DG Chest 2 View  Result Date: 05/16/2022 CLINICAL DATA:  Lower extremity edema EXAM: CHEST - 2 VIEW COMPARISON:  10/31/2020 FINDINGS: Frontal and lateral views of the chest demonstrate a stable cardiac silhouette. There is increased central vascular congestion with patchy perihilar airspace disease greatest in the right infrahilar region. No effusion or pneumothorax. No acute bony abnormalities. IMPRESSION: 1. Central vascular congestion and mild pulmonary edema. Electronically Signed   By: Randa Ngo M.D.   On: 05/16/2022 15:37    EKG: I independently viewed the EKG done and my findings are as followed: Normal sinus rhythm rate of 92.  Nonspecific ST-T changes.  QTc 613.  Assessment/Plan Present on Admission:  Acute liver failure  Principal Problem:   Acute liver failure  Acute liver failure in the setting of decompensated liver cirrhosis with ascites Jaundice with T bili 33, coagulopathy with INR of 3.4, ascites, hypoalbuminemia 1.8, pitting  edema. Avoid hepatotoxic agents Trend LFTs IV Lasix 20 mg daily, spironolactone 25 mg daily 1 dose of albumin 12.5 g x 1  Lactulose for ammonia 72, repeat 44. MELD score 35, 52.6% estimated 3 months mortality Followed by [place] outpatient. GI consulted and will see in consultation IR consulted for possible paracentesis, diagnostic and therapeutic-IV albumin ordered for paracentesis.  Acute blood loss anemia in the setting of  anemia of chronic disease Hemoglobin drop 6.1 of 6.3 with positive FOBT. Possible esophagitis on prior CT scan done in May 2023 at outside facility. IV Protonix 40 mg twice daily started 1 unit PRBCs ordered to be transfused GI consulted, will see in consultation. Rocephin started for cirrhosis with bleeding. Repeat CBC in the morning after 1 unit PRBC blood transfusion  Elevated troponin, suspect demand ischemia in the setting of severe anemia Troponin peaked at 72 and trended down No evidence of acute ischemia on 12 lead EKG Closely monitor on telemetry Follow 2D echo  Chronic  thrombocytopenia Platelet count 51 Monitor and repeat CBC in the morning  Refractory hypokalemia Serum potassium 2.5, repleted orally and intravenously Magnesium 2.3 Continue replacement Spironolactone added  Hypervolemic hyponatremia Serum sodium 130 Fluid restriction, less than 2 L/day Repeat chemistry panel in the morning  Pulmonary edema, suspect cardiogenic Elevated BNP greater than 800 Lower extremity pitting edema Follow 2D echo  Prolonged Qtc QTc on admission twelve-lead EKG greater than 600 Optimize magnesium and potassium levels Goal potassium level greater than 4.0 Goal magnesium level greater than 2.0  Volume overload, likely from hypoalbuminemia in the setting of decompensated liver cirrhosis Albumin 1.8 Monitor  Coagulopathy in the setting of decompensated liver cirrhosis INR 3.4 Monitor  Physical debility PT OT assessment Fall  precautions  Formal alcohol abuser Endorses quitting alcohol use more than 3 weeks ago Encouraged to remain abstinent    Critical care time, 65 minutes.    DVT prophylaxis: SCDs.  Not on pharmacological DVT prophylaxis due to concern for upper GI bleed, thrombocytopenia.   Code Status: Full code  Family Communication: None at bedside  Disposition Plan: Admitted to stepdown unit  Consults called: GI  Admission status: Inpatient status.   Status is: Inpatient The patient requires at least 2 midnights for further evaluation and treatment of present condition.   Darlin Drop MD [facility] Pager [phone]  If 7PM-7AM, please contact night-coverage [email or web address] Password [email or web address]  05/17/2022, 5:25 AM

## 2022-05-17 NOTE — ED Notes (Signed)
[facility] taking over care for patient at this time.

## 2022-05-17 NOTE — TOC Progression Note (Signed)
Transition of Care [facility]) - Progression Note    Patient Details  Name: Xayden Linsey MRN: [id number] Date of Birth: [birth year]  Transition of Care [facility]) CM/SW Contact  Servando Snare, [facility] Phone Number: 05/17/2022, 9:16 AM  Clinical Narrative:     Transition of Care (TOC) Screening Note   Patient Details  Name: Edmar Blankenburg Date of Birth: April 10, 1976   Transition of Care [facility]) CM/SW Contact:    Servando Snare, LCSW Phone Number: 05/17/2022, 9:16 AM    Transition of Care Department [facility]) has reviewed patient and no TOC needs have been identified at this time. We will continue to monitor patient advancement through interdisciplinary progression rounds. If new patient transition needs arise, please place a TOC consult.          Expected Discharge Plan and Services                                                 Social Determinants of Health (SDOH) Interventions    Readmission Risk Interventions     No data to display

## 2022-05-17 NOTE — Progress Notes (Signed)
Seth Holmes Needs  NAT:[id number] DOB: 1976/03/06 DOA: 05/16/2022 PCP: [facility]    Brief Narrative:  46 year old with a history of tobacco abuse, alcohol abuse with cirrhosis and ascites who is followed at [facility], chronic macrocytic anemia, and colon polyps who presented to the ER with bilateral lower extremity leg swelling despite home Lasix use.  Work-up in the ER revealed severe hypoalbuminemia, decompensated cirrhosis of the liver with ascites, and associated coagulopathy with anemia and severe jaundice.  Consultants:  [facility]  Goals of Care:  Code Status: Full Code   DVT prophylaxis: SCDs  Interim Hx: Afebrile since admission.  Blood pressure stable.  Saturations 100% on room air.  Resting comfortably in his room post paracentesis.  Denies any new complaints.  States that he is feeling better.  No shortness of breath.  No chest pain.  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 No spontaneous bleeding evident at present -monitor trend  Anemia of chronic disease with anemia of acute blood loss Guaiac positive on ER evaluation -suspected esophagitis/gastritis with known varices -continue PPI - hemoglobin holding steady for now /no sign of large-scale bleeding  Chronic thrombocytopenia Due to above - platelet count holding steady for now  Refractory hypokalemia Continue supplementation and follow - magnesium is normal  Hyponatremia Due to severe liver disease  Multifactorial pulmonary edema  Family Communication: No family present at time of exam Disposition: Remain in  SDU for today    Objective: Blood pressure (!) 123/45, pulse 79, temperature 99.2 F (37.3 C), temperature source Oral, resp. rate (!) 22, height 5\' 8"  (1.727 m), weight 81.7 kg, SpO2 100 %.  Intake/Output Summary (Last 24 hours) at 05/17/2022 1342 Last data filed at 05/17/2022 1300 Gross per 24 hour  Intake 1715.45 ml  Output 2800 ml  Net -1084.55 ml   Filed Weights   05/16/22 1450 05/17/22 0105  Weight: 83.8 kg 81.7 kg    Examination: General: No acute respiratory distress Lungs: Clear to auscultation bilaterally without wheezes or crackles Cardiovascular: Regular rate and rhythm without murmur gallop or rub normal S1 and S2 Abdomen: Distended with obvious ascites without rebound, bowel sounds positive, no appreciable mass Extremities: 2+ pitting edema bilateral lower extremities  CBC: Recent Labs  Lab 05/16/22 1504 05/17/22 0136 05/17/22 0921  WBC 18.0* 14.8* 12.2*  NEUTROABS  --  11.5* 9.7*  HGB 6.3* 6.1* 6.6*  HCT 19.1* 18.2* 19.5*  MCV 117.9* 120.5* 114.0*  PLT 56* 51* 50*   Basic Metabolic Panel: Recent Labs  Lab 05/16/22 1504 05/16/22 2129 05/17/22 0136 05/17/22 0921  NA 130*  --  130* 132*  K 2.5*  --  2.5* 3.0*  CL 95*  --  99 102  CO2 24  --  23 24  GLUCOSE 101*  --  96 102*  BUN 13  --  12 12  CREATININE 1.51*  --  0.97 0.98  CALCIUM 8.7*  --  8.1* 8.2*  MG  --  1.9 2.3  --   PHOS  --   --  3.5  --    GFR: Estimated Creatinine Clearance: 93.1 mL/min (by C-G formula based  on SCr of 0.98 mg/dL).   Scheduled Meds:  Chlorhexidine Gluconate Cloth  6 each Topical Daily   folic acid  1 mg Oral Daily   furosemide  20 mg Intravenous Q12H   lactulose  10 g Oral TID   mouth rinse  15 mL Mouth Rinse 4 times per day   pantoprazole (PROTONIX) IV  40 mg Intravenous Q12H   potassium chloride  40 mEq Oral BID   spironolactone  25 mg Oral Daily   thiamine  100 mg Oral Daily   Continuous Infusions:  cefTRIAXone (ROCEPHIN)  IV Stopped (05/17/22 0450)      LOS: 0 days   Lonia Blood, MD [facility] Office  [phone] Pager - Text Page per Loretha Stapler  If 7PM-7AM, please contact night-coverage per Amion 05/17/2022, 1:42 PM

## 2022-05-18 DIAGNOSIS — K7200 Acute and subacute hepatic failure without coma: Secondary | ICD-10-CM | POA: Diagnosis not present

## 2022-05-18 LAB — CBC
HCT: 21.9 % — ABNORMAL LOW (ref 39.0–52.0)
Hemoglobin: 7.4 g/dL — ABNORMAL LOW (ref 13.0–17.0)
MCH: 37.6 pg — ABNORMAL HIGH (ref 26.0–34.0)
MCHC: 33.8 g/dL (ref 30.0–36.0)
MCV: 111.2 fL — ABNORMAL HIGH (ref 80.0–100.0)
Platelets: 49 10*3/uL — ABNORMAL LOW (ref 150–400)
RBC: 1.97 MIL/uL — ABNORMAL LOW (ref 4.22–5.81)
RDW: 26.7 % — ABNORMAL HIGH (ref 11.5–15.5)
WBC: 13.3 10*3/uL — ABNORMAL HIGH (ref 4.0–10.5)
nRBC: 0.5 % — ABNORMAL HIGH (ref 0.0–0.2)

## 2022-05-18 LAB — URINE CULTURE: Culture: >=100000 — AB

## 2022-05-18 LAB — COMPREHENSIVE METABOLIC PANEL
ALT: 58 U/L — ABNORMAL HIGH (ref 0–44)
AST: 109 U/L — ABNORMAL HIGH (ref 15–41)
Albumin: 1.8 g/dL — ABNORMAL LOW (ref 3.5–5.0)
Alkaline Phosphatase: 67 U/L (ref 38–126)
Anion gap: 9 (ref 5–15)
BUN: 13 mg/dL (ref 6–20)
CO2: 21 mmol/L — ABNORMAL LOW (ref 22–32)
Calcium: 8.5 mg/dL — ABNORMAL LOW (ref 8.9–10.3)
Chloride: 103 mmol/L (ref 98–111)
Creatinine, Ser: 1.19 mg/dL (ref 0.61–1.24)
GFR, Estimated: >60 mL/min (ref >60)
Glucose, Bld: 139 mg/dL — ABNORMAL HIGH (ref 70–99)
Potassium: 3.6 mmol/L (ref 3.5–5.1)
Sodium: 133 mmol/L — ABNORMAL LOW (ref 135–145)
Total Bilirubin: 27.8 mg/dL (ref 0.3–1.2)
Total Protein: 7.1 g/dL (ref 6.5–8.1)

## 2022-05-18 LAB — PHOSPHORUS: Phosphorus: 3.2 mg/dL (ref 2.5–4.6)

## 2022-05-18 LAB — MAGNESIUM: Magnesium: 1.9 mg/dL (ref 1.7–2.4)

## 2022-05-18 NOTE — Progress Notes (Signed)
  Transition of Care (TOC) Screening Note   Patient Details  Name: Seth Holmes Date of Birth: July 07, 1976   Transition of Care [facility]) CM/SW Contact:    Kimber Relic, LCSW Phone Number: 05/18/2022, 4:30 PM    Transition of Care Department [facility]) has reviewed patient and no TOC needs have been identified at this time. We will continue to monitor patient advancement through interdisciplinary progression rounds. If new patient transition needs arise, please place a TOC consult.

## 2022-05-18 NOTE — Progress Notes (Signed)
PT Cancellation Note  Patient Details Name: Seth Holmes MRN: [id number] DOB: 07-28-1975   Cancelled Treatment:     PT order received but eval deferred.  Pt IND in rooms and hallway with no PT needs identified.  Will dc from PT service.   Ishan Sanroman 05/18/2022, 1:28 PM

## 2022-05-18 NOTE — Progress Notes (Signed)
Seth Holmes  ZHG:[id number] DOB: 11-Dec-1975 DOA: 05/16/2022 PCP: [facility]    Brief Narrative:  46 year old with a history of tobacco abuse, alcohol abuse with cirrhosis and ascites who is followed at [facility], chronic macrocytic anemia, and colon polyps who presented to the ER with bilateral lower extremity leg swelling despite home Lasix use.  Work-up in the ER revealed severe hypoalbuminemia, decompensated cirrhosis of the liver with ascites, and associated coagulopathy with anemia and severe jaundice.  Consultants:  [facility]  Goals of Care:  Code Status: Full Code   DVT prophylaxis: SCDs  Interim Hx: No acute events recorded overnight.  Afebrile.  Vital signs stable with systolic blood pressure consistently in the mid 90s to mid 100 range.  Hemoglobin has improved status post 1 unit PRBC transfused yesterday.  Reports that he is feeling much better overall.  Denies chest pain abdominal pain or shortness of breath.  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 No spontaneous bleeding evident at present - monitor trend  Anemia of chronic disease with anemia of acute blood loss Guaiac positive on ER evaluation -suspected esophagitis/gastritis with known varices -continue PPI - hemoglobin holding steady for now / no sign of large-scale bleeding - has been transfused a total of 2 units PRBCs since  admission -continue to monitor hemoglobin in serial fashion  Chronic thrombocytopenia Due to above - platelet count holding steady   Refractory hypokalemia Continue supplementation and follow - magnesium is normal  Hyponatremia Due to severe liver disease - improving with diuresis  Multifactorial pulmonary edema Clinically stabilizing with ongoing diuresis  Disposition: Transfer to progressive bed -begin PT/OT  Objective: Blood pressure (!) 106/45, pulse 86, temperature 97.7 F (36.5 C), temperature source Oral, resp. rate (!) 21, height 5\' 8"  (1.727 m), weight 81.7 kg, SpO2 100 %.  Intake/Output Summary (Last 24 hours) at 05/18/2022 0802 Last data filed at 05/18/2022 0757 Gross per 24 hour  Intake 1310.84 ml  Output 3950 ml  Net -2639.16 ml    Filed Weights   05/16/22 1450 05/17/22 0105  Weight: 83.8 kg 81.7 kg   Examination: General: No acute respiratory distress Lungs: Clear to auscultation bilaterally -blunting of breath sounds in bilateral bases Cardiovascular: RRR without murmur or rub Abdomen: Distended with obvious ascites without rebound, bowel sounds positive, no appreciable mass -no significant change since yesterday Extremities: 2+ pitting edema bilateral lower extremities  CBC: Recent Labs  Lab 05/17/22 0136 05/17/22 0921 05/17/22 1630 05/18/22 0258  WBC 14.8* 12.2*  --  13.3*  NEUTROABS 11.5* 9.7*  --   --   HGB 6.1* 6.6* 7.5* 7.4*  HCT 18.2* 19.5* 21.9* 21.9*  MCV 120.5* 114.0*  --  111.2*  PLT 51* 50*  --  49*    Basic Metabolic Panel: Recent Labs  Lab 05/16/22 2129 05/17/22 0136 05/17/22 0921 05/18/22 0258  NA  --  130* 132* 133*  K  --  2.5* 3.0* 3.6  CL  --  99  102 103  CO2  --  23 24 21*  GLUCOSE  --  96 102* 139*  BUN  --  12 12 13   CREATININE  --  0.97 0.98 1.19  CALCIUM  --  8.1* 8.2* 8.5*  MG 1.9 2.3  --  1.9  PHOS  --  3.5  --  3.2    GFR: Estimated Creatinine Clearance: 76.6 mL/min (by C-G formula based on SCr of 1.19  mg/dL).   Scheduled Meds:  Chlorhexidine Gluconate Cloth  6 each Topical Daily   folic acid  1 mg Oral Daily   furosemide  40 mg Intravenous Q12H   lactulose  10 g Oral TID   mouth rinse  15 mL Mouth Rinse 4 times per day   pantoprazole  40 mg Oral BID   potassium chloride  40 mEq Oral TID   prednisoLONE  40 mg Oral Daily   spironolactone  25 mg Oral Daily   thiamine  100 mg Oral Daily   Continuous Infusions:  cefTRIAXone (ROCEPHIN)  IV Stopped (05/18/22 0458)     LOS: 1 day   05/20/22, MD [facility] Office  [phone] Pager - Text Page per [phone]  If 7PM-7AM, please contact night-coverage per Amion 05/18/2022, 8:02 AM

## 2022-05-18 NOTE — Progress Notes (Signed)
[facility] Progress Note  Seth Holmes 46 y.o. 01/19/1976   Subjective: Less lethargic. Able to have a conversation without falling asleep. Sitting in bedside chair. Loose stools on Lactulose. Denies abdominal pain/N/V. Tolerating low sodium diet.  Objective: Vital signs: Vitals:   05/18/22 1100 05/18/22 1158  BP: (!) 106/43 (!) 107/35  Pulse:  86  Resp: (!) 25 (!) 23  Temp:  98.2 F (36.8 C)  SpO2:  99%    Physical Exam: Gen: lethargic, alert, no acute distress, chronically ill-appearing  HEENT: +icteric sclera CV: RRR Chest: CTA B Abd: mild distention, nontender, +BS Ext: no edema  Lab Results: Recent Labs    05/17/22 0136 05/17/22 0921 05/18/22 0258  NA 130* 132* 133*  K 2.5* 3.0* 3.6  CL 99 102 103  CO2 23 24 21*  GLUCOSE 96 102* 139*  BUN 12 12 13   CREATININE 0.97 0.98 1.19  CALCIUM 8.1* 8.2* 8.5*  MG 2.3  --  1.9  PHOS 3.5  --  3.2   Recent Labs    05/17/22 0921 05/18/22 0258  AST 96* 109*  ALT 52* 58*  ALKPHOS 57 67  BILITOT 26.5* 27.8*  PROT 6.9 7.1  ALBUMIN 1.9* 1.8*   Recent Labs    05/17/22 0136 05/17/22 0921 05/17/22 1630 05/18/22 0258  WBC 14.8* 12.2*  --  13.3*  NEUTROABS 11.5* 9.7*  --   --   HGB 6.1* 6.6* 7.5* 7.4*  HCT 18.2* 19.5* 21.9* 21.9*  MCV 120.5* 114.0*  --  111.2*  PLT 51* 50*  --  49*      Assessment/Plan: Decompensated cirrhosis - s/p paracentesis with 700 cc removed; negative for SBP. Steroids started for alcoholic hepatitis. Continue supportive care. Low sodium diet. Will f/u.   Seth Holmes 05/18/2022, 2:05 PM  Questions please call [phone] Patient ID: Seth Holmes, male   DOB: 1976/02/02, 46 y.o.   MRN: [id number]

## 2022-05-19 DIAGNOSIS — K7200 Acute and subacute hepatic failure without coma: Secondary | ICD-10-CM | POA: Diagnosis not present

## 2022-05-19 LAB — COMPREHENSIVE METABOLIC PANEL
ALT: 78 U/L — ABNORMAL HIGH (ref 0–44)
AST: 144 U/L — ABNORMAL HIGH (ref 15–41)
Albumin: 1.8 g/dL — ABNORMAL LOW (ref 3.5–5.0)
Alkaline Phosphatase: 83 U/L (ref 38–126)
Anion gap: 6 (ref 5–15)
BUN: 17 mg/dL (ref 6–20)
CO2: 23 mmol/L (ref 22–32)
Calcium: 8.8 mg/dL — ABNORMAL LOW (ref 8.9–10.3)
Chloride: 104 mmol/L (ref 98–111)
Creatinine, Ser: 1.21 mg/dL (ref 0.61–1.24)
GFR, Estimated: >60 mL/min (ref >60)
Glucose, Bld: 107 mg/dL — ABNORMAL HIGH (ref 70–99)
Potassium: 3.7 mmol/L (ref 3.5–5.1)
Sodium: 133 mmol/L — ABNORMAL LOW (ref 135–145)
Total Bilirubin: 26.1 mg/dL (ref 0.3–1.2)
Total Protein: 6.8 g/dL (ref 6.5–8.1)

## 2022-05-19 LAB — PROTIME-INR
INR: 3.7 — ABNORMAL HIGH (ref 0.8–1.2)
Prothrombin Time: 36.7 seconds — ABNORMAL HIGH (ref 11.4–15.2)

## 2022-05-19 LAB — CBC
HCT: 20.1 % — ABNORMAL LOW (ref 39.0–52.0)
Hemoglobin: 6.8 g/dL — CL (ref 13.0–17.0)
MCH: 37.8 pg — ABNORMAL HIGH (ref 26.0–34.0)
MCHC: 33.8 g/dL (ref 30.0–36.0)
MCV: 111.7 fL — ABNORMAL HIGH (ref 80.0–100.0)
Platelets: 52 10*3/uL — ABNORMAL LOW (ref 150–400)
RBC: 1.8 MIL/uL — ABNORMAL LOW (ref 4.22–5.81)
RDW: 26.7 % — ABNORMAL HIGH (ref 11.5–15.5)
WBC: 14.2 10*3/uL — ABNORMAL HIGH (ref 4.0–10.5)
nRBC: 0.1 % (ref 0.0–0.2)

## 2022-05-19 LAB — APTT: aPTT: 55 seconds — ABNORMAL HIGH (ref 24–36)

## 2022-05-19 LAB — HEMOGLOBIN AND HEMATOCRIT, BLOOD
HCT: 28.4 % — ABNORMAL LOW (ref 39.0–52.0)
Hemoglobin: 9.5 g/dL — ABNORMAL LOW (ref 13.0–17.0)

## 2022-05-19 LAB — PREPARE RBC (CROSSMATCH)

## 2022-05-19 MED ORDER — POTASSIUM CHLORIDE CRYS ER 20 MEQ PO TBCR
40.0000 meq | EXTENDED_RELEASE_TABLET | Freq: Three times a day (TID) | ORAL | Status: DC
  Administered 2022-05-19 – 2022-05-20 (×5): 40 meq via ORAL
  Filled 2022-05-19 (×6): qty 2

## 2022-05-19 MED ORDER — PHYTONADIONE 5 MG PO TABS
10.0000 mg | ORAL_TABLET | Freq: Every day | ORAL | Status: AC
  Administered 2022-05-19 – 2022-05-21 (×3): 10 mg via ORAL
  Filled 2022-05-19 (×4): qty 2

## 2022-05-19 MED ORDER — SODIUM CHLORIDE 0.9% IV SOLUTION: Freq: Once | INTRAVENOUS | Status: AC

## 2022-05-19 NOTE — Progress Notes (Signed)
Seth Holmes  SWN:[id number] DOB: 03-Oct-1975 DOA: 05/16/2022 PCP: [facility]    Brief Narrative:  46 year old with a history of tobacco abuse, alcohol abuse with cirrhosis and ascites who is followed at [facility], chronic macrocytic anemia, and colon polyps who presented to the ER with bilateral lower extremity leg swelling despite home Lasix use.  Work-up in the ER revealed severe hypoalbuminemia, decompensated cirrhosis of the liver with ascites, and associated coagulopathy with anemia and severe jaundice.  Consultants:  [facility]  Goals of Care:  Code Status: Full Code   DVT prophylaxis: SCDs  Interim Hx: Hemoglobin drifted below 7.0 on routine follow-up and therefore transfusion was ordered this morning.  Otherwise no acute events reported.  Vital signs are stable and the patient is afebrile.  Up and moving around the room.  No new complaints.  Resting comfortably.  Mildly short of breath/not back to baseline, but reports overall feels improved.  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 No spontaneous bleeding evident at present - monitor trend  Anemia of chronic disease with anemia of acute blood loss Guaiac positive on ER evaluation - suspected esophagitis/gastritis with known varices - continue PPI -hemoglobin drifted down below 7.0 overnight but despite this there are no signs of large-scale  bleeding presently - has been transfused a total of 3 units PRBCs since admission - continue to monitor hemoglobin in serial fashion - GI following   Chronic thrombocytopenia Due to above - platelet count holding steady   Refractory hypokalemia Continue supplementation and follow - magnesium is normal  Hyponatremia Due to severe liver disease - improving with diuresis  Multifactorial pulmonary edema Clinically stabilizing with ongoing diuresis  Disposition: Eventual discharge home when hemoglobin stable and sufficiently diuresed  Objective: Blood pressure (!) 103/49, pulse 72, temperature 98 F (36.7 C), temperature source Oral, resp. rate 17, height 5\' 8"  (1.727 m), weight 81.7 kg, SpO2 100 %.  Intake/Output Summary (Last 24 hours) at 05/19/2022 1009 Last data filed at 05/19/2022 0409 Gross per 24 hour  Intake 1080 ml  Output 3050 ml  Net -1970 ml    Filed Weights   05/16/22 1450 05/17/22 0105  Weight: 83.8 kg 81.7 kg   Examination: General: No acute respiratory distress Lungs: Clear to auscultation bilaterally -blunting of breath sounds in bilateral bases without change Cardiovascular: RRR without murmur or rub Abdomen: Distended with obvious ascites without rebound, bowel sounds positive, no appreciable mass -no significant change since yesterday Extremities: 2+ pitting edema bilateral lower extremities without significant change  CBC: Recent Labs  Lab 05/17/22 0136 05/17/22 0921 05/17/22 1630 05/18/22 0258 05/19/22 0516  WBC 14.8* 12.2*  --  13.3* 14.2*  NEUTROABS 11.5* 9.7*  --   --   --   HGB 6.1* 6.6* 7.5* 7.4* 6.8*  HCT 18.2* 19.5* 21.9* 21.9* 20.1*  MCV 120.5* 114.0*  --  111.2* 111.7*  PLT 51* 50*  --  49* 52*    Basic Metabolic Panel: Recent Labs  Lab 05/16/22 2129 05/17/22 0136 05/17/22 0921 05/18/22 0258 05/19/22 0516  NA  --  130* 132* 133* 133*  K  --  2.5* 3.0* 3.6 3.7  CL  --  99 102 103 104  CO2  --  23 24 21* 23  GLUCOSE  --  96  102* 139* 107*  BUN  --  12 12 13 17   CREATININE  --  0.97 0.98 1.19 1.21  CALCIUM  --  8.1* 8.2* 8.5* 8.8*  MG 1.9 2.3  --  1.9  --   PHOS  --  3.5  --  3.2  --     GFR: Estimated Creatinine Clearance: 75.4 mL/min (by C-G formula based on SCr of 1.21 mg/dL).   Scheduled Meds:  Chlorhexidine Gluconate Cloth  6 each Topical Daily   folic acid  1 mg Oral Daily   furosemide  40 mg Intravenous Q12H   lactulose  10 g Oral TID   mouth rinse  15 mL Mouth Rinse 4 times per day   pantoprazole  40 mg Oral BID   potassium chloride  40 mEq Oral TID   prednisoLONE  40 mg Oral Daily   spironolactone  25 mg Oral Daily   thiamine  100 mg Oral Daily   Continuous Infusions:  cefTRIAXone (ROCEPHIN)  IV 2 g (05/19/22 0400)     LOS: 2 days   Cherene Altes, MD [facility] Office  [phone] Pager - Text Page per Amion  If 7PM-7AM, please contact night-coverage per Amion 05/19/2022, 10:09 AM

## 2022-05-19 NOTE — Progress Notes (Signed)
[facility] Progress Note  Seth Holmes 46 y.o. 03-May-1976   Subjective: Resting in bed watching TV. Reports "light color blood" in his stools overnight.  Brown stool documented this morning. Denies abdominal pain/N/V.  Objective: Vital signs: Vitals:   05/19/22 0840 05/19/22 1120  BP: (!) 103/49 (!) 107/52  Pulse: 72 69  Resp: 17 17  Temp: 98 F (36.7 C) 97.9 F (36.6 C)  SpO2: 100% 99%    Physical Exam: Gen: lethargic, no acute distress, jaundice HEENT: +icteric sclera CV: RRR Chest: CTA B Abd: soft, nontender, nondistended, +BS Ext: no edema  Lab Results: Recent Labs    05/17/22 0136 05/17/22 0921 05/18/22 0258 05/19/22 0516  NA 130*   < > 133* 133*  K 2.5*   < > 3.6 3.7  CL 99   < > 103 104  CO2 23   < > 21* 23  GLUCOSE 96   < > 139* 107*  BUN 12   < > 13 17  CREATININE 0.97   < > 1.19 1.21  CALCIUM 8.1*   < > 8.5* 8.8*  MG 2.3  --  1.9  --   PHOS 3.5  --  3.2  --    < > = values in this interval not displayed.   Recent Labs    05/18/22 0258 05/19/22 0516  AST 109* 144*  ALT 58* 78*  ALKPHOS 67 83  BILITOT 27.8* 26.1*  PROT 7.1 6.8  ALBUMIN 1.8* 1.8*   Recent Labs    05/17/22 0136 05/17/22 0921 05/17/22 1630 05/18/22 0258 05/19/22 0516  WBC 14.8* 12.2*  --  13.3* 14.2*  NEUTROABS 11.5* 9.7*  --   --   --   HGB 6.1* 6.6*   < > 7.4* 6.8*  HCT 18.2* 19.5*   < > 21.9* 20.1*  MCV 120.5* 114.0*  --  111.2* 111.7*  PLT 51* 50*  --  49* 52*   < > = values in this interval not displayed.      Assessment/Plan: Decompensated cirrhosis with ascites - Ascites neg for SBP. Coagulopathic. Continue lactulose. Supportive care. Alcoholic hepatitis - continue Prednisolone to complete a 28 day course. Anemia - Hgb 6.8 (7.4); no signs of active GI bleeding. Agree with transfusion.  Nutrition - Continue low sodium diet. [facility] will f/u tomorrow.    Seth Holmes 05/19/2022, 11:59 AM  Questions please call [phone] Patient  ID: Seth Holmes, male   DOB: [birth year], 47 y.o.   MRN: [id number]

## 2022-05-19 NOTE — Progress Notes (Signed)
Pump channel just ordered from portable equipment to be brought up for patient for blood transfusion

## 2022-05-19 NOTE — Progress Notes (Signed)
OT Cancellation Note and Discharge  Patient Details Name: Seth Holmes MRN: [id number] DOB: 06/08/1976   Cancelled Treatment:    Reason Eval/Treat Not Completed: OT screened, Per PT, Pt is independent in room and hallway, been managing own needs without staff intervention. OT will sign off, if status changes, please feel free to re-order.  [place] 05/19/2022, 8:51 AM  Jesse Sans OTR/L [place] Office: [phone]

## 2022-05-20 DIAGNOSIS — K7200 Acute and subacute hepatic failure without coma: Secondary | ICD-10-CM | POA: Diagnosis not present

## 2022-05-20 LAB — COMPREHENSIVE METABOLIC PANEL
ALT: 90 U/L — ABNORMAL HIGH (ref 0–44)
AST: 140 U/L — ABNORMAL HIGH (ref 15–41)
Albumin: 1.9 g/dL — ABNORMAL LOW (ref 3.5–5.0)
Alkaline Phosphatase: 104 U/L (ref 38–126)
Anion gap: 7 (ref 5–15)
BUN: 21 mg/dL — ABNORMAL HIGH (ref 6–20)
CO2: 23 mmol/L (ref 22–32)
Calcium: 8.8 mg/dL — ABNORMAL LOW (ref 8.9–10.3)
Chloride: 103 mmol/L (ref 98–111)
Creatinine, Ser: 1.12 mg/dL (ref 0.61–1.24)
GFR, Estimated: >60 mL/min (ref >60)
Glucose, Bld: 108 mg/dL — ABNORMAL HIGH (ref 70–99)
Potassium: 4 mmol/L (ref 3.5–5.1)
Sodium: 133 mmol/L — ABNORMAL LOW (ref 135–145)
Total Bilirubin: 24.4 mg/dL (ref 0.3–1.2)
Total Protein: 7.2 g/dL (ref 6.5–8.1)

## 2022-05-20 LAB — TYPE AND SCREEN
ABO/RH(D): O POS
Antibody Screen: NEGATIVE
Unit division: 0
Unit division: 0
Unit division: 0

## 2022-05-20 LAB — BODY FLUID CULTURE W GRAM STAIN: Culture: NO GROWTH

## 2022-05-20 LAB — BPAM RBC
Blood Product Expiration Date: 202311202359
Blood Product Expiration Date: 202311262359
Blood Product Expiration Date: 202311262359
ISSUE DATE / TIME: 202310270351
ISSUE DATE / TIME: 202310271251
ISSUE DATE / TIME: 202310290815
Unit Type and Rh: 5100
Unit Type and Rh: 5100
Unit Type and Rh: 5100

## 2022-05-20 LAB — CBC
HCT: 24.1 % — ABNORMAL LOW (ref 39.0–52.0)
Hemoglobin: 8.2 g/dL — ABNORMAL LOW (ref 13.0–17.0)
MCH: 36.4 pg — ABNORMAL HIGH (ref 26.0–34.0)
MCHC: 34 g/dL (ref 30.0–36.0)
MCV: 107.1 fL — ABNORMAL HIGH (ref 80.0–100.0)
Platelets: 56 10*3/uL — ABNORMAL LOW (ref 150–400)
RBC: 2.25 MIL/uL — ABNORMAL LOW (ref 4.22–5.81)
RDW: 28.2 % — ABNORMAL HIGH (ref 11.5–15.5)
WBC: 14.1 10*3/uL — ABNORMAL HIGH (ref 4.0–10.5)
nRBC: 0.1 % (ref 0.0–0.2)

## 2022-05-20 LAB — PROTIME-INR
INR: 3.8 — ABNORMAL HIGH (ref 0.8–1.2)
Prothrombin Time: 36.8 seconds — ABNORMAL HIGH (ref 11.4–15.2)

## 2022-05-20 MED ORDER — FUROSEMIDE 40 MG PO TABS
40.0000 mg | ORAL_TABLET | Freq: Two times a day (BID) | ORAL | Status: DC
  Administered 2022-05-20 – 2022-05-21 (×2): 40 mg via ORAL
  Filled 2022-05-20 (×2): qty 1

## 2022-05-20 MED ORDER — SODIUM CHLORIDE 0.9% IV SOLUTION: Freq: Once | INTRAVENOUS | Status: DC

## 2022-05-20 NOTE — Progress Notes (Addendum)
[facility]astroenterology Progress Note  Seth Holmes 46 y.o. 1975-10-17   Subjective: Patient seen and examined sitting in bed.  He is eating breakfast.  Notes he is having 3 bowel movements daily with lactulose.  He has noticed a small amount of bright red blood with bowel movements.  Continues to be jaundiced.  Denies abdominal pain.    Objective: Vital signs in last 24 hours: Vitals:   05/19/22 2031 05/20/22 0448  BP: 99/62 (!) 106/54  Pulse: 76   Resp: 16 19  Temp: 98.3 F (36.8 C) 98.3 F (36.8 C)  SpO2: 100% 100%    Physical Exam:  General:  Alert, cooperative, no distress, appears stated age, jaundice  Head:  Normocephalic, without obvious abnormality, atraumatic  Eyes:  icteric sclera, EOM's intact  Lungs:   Clear to auscultation bilaterally, respirations unlabored  Heart:  Regular rate and rhythm, S1, S2 normal  Abdomen:   Soft, non-tender, bowel sounds active all four quadrants,  no masses,   Extremities: Extremities normal, atraumatic, bilateral lower extremity edema  Pulses: 2+ and symmetric    Lab Results: Recent Labs    05/18/22 0258 05/19/22 0516 05/20/22 0451  NA 133* 133* 133*  K 3.6 3.7 4.0  CL 103 104 103  CO2 21* 23 23  GLUCOSE 139* 107* 108*  BUN 13 17 21*  CREATININE 1.19 1.21 1.12  CALCIUM 8.5* 8.8* 8.8*  MG 1.9  --   --   PHOS 3.2  --   --    Recent Labs    05/19/22 0516 05/20/22 0451  AST 144* 140*  ALT 78* 90*  ALKPHOS 83 104  BILITOT 26.1* 24.4*  PROT 6.8 7.2  ALBUMIN 1.8* 1.9*   Recent Labs    05/19/22 0516 05/19/22 1438 05/20/22 0451  WBC 14.2*  --  14.1*  HGB 6.8* 9.5* 8.2*  HCT 20.1* 28.4* 24.1*  MCV 111.7*  --  107.1*  PLT 52*  --  56*   Recent Labs    05/19/22 0516 05/20/22 0451  LABPROT 36.7* 36.8*  INR 3.7* 3.8*     Assessment Decompensated liver cirrhosis - due to history of alcohol abuse, last drink 3 weeks ago, paracentesis 05/17/2022 700 mL fluid removed, ascites negative for SBP.  Ascites   Alcoholic hepatitis -patient now on prednisolone 40 mg daily started on 1027, T. bili improving. MD 130.2 Anemia -small amount of bright red blood with bowel movements, hemoglobin stable.  HGB 8.2 Platelets 56 AST 140 ALT 90  Alkphos 104 TBili 24.4 GFR >60  INR 05/20/2022 3.8  MELD 3.0: 37 at 05/20/2022  4:51 AM MELD-Na: 35 at 05/20/2022  4:51 AM Calculated from: Serum Creatinine: 1.12 mg/dL at 05/20/2022  4:51 AM Serum Sodium: 133 mmol/L at 05/20/2022  4:51 AM Total Bilirubin: 24.4 mg/dL at 05/20/2022  4:51 AM Serum Albumin: 1.9 g/dL at 05/20/2022  4:51 AM INR(ratio): 3.8 at 05/20/2022  4:51 AM Age at listing (hypothetical): 45 years Sex: Male at 05/20/2022  4:51 AM    Plan: Discontinue primary SBP prophylaxis. Continue folic acid, thiamine, multivitamin Continue Protonix 40 mg twice daily Continue prednisolone 40 mg daily, will recheck Lillie score tomorrow. Continue spironolactone 20 mg daily Change Lasix 40 mg to oral twice daily. [facility] will follow  Charlott Rakes PA-C 05/20/2022, 2:22 PM  Contact #  [phone]

## 2022-05-20 NOTE — Progress Notes (Signed)
Seth Holmes  YKD:[id number] DOB: 07-Dec-1975 DOA: 05/16/2022 PCP: [facility]    Brief Narrative:  46 year old with a history of tobacco abuse, alcohol abuse with cirrhosis and ascites who is followed at [facility], chronic macrocytic anemia, and colon polyps who presented to the ER with bilateral lower extremity leg swelling despite home Lasix use.  Work-up in the ER revealed severe hypoalbuminemia, decompensated cirrhosis of the liver with ascites, and associated coagulopathy with anemia and severe jaundice.  Consultants:  [facility]  Goals of Care:  Code Status: Full Code   DVT prophylaxis: SCDs  Interim Hx: No acute events recorded overnight.  Afebrile.  Vital signs stable.  Hemoglobin leveling off at 8.2 status post transfusion yesterday. Standing comfortably at the sink brushing his teeth at the time of my visit. Denies SOB, abdom pain, n/v. Reports ongoing frequent urination w/ lasix dosing.   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 No spontaneous bleeding evident at present -vitamin K has had no effect, as expected  Anemia of chronic disease with anemia of acute blood loss Guaiac positive on ER evaluation - suspected esophagitis/gastritis with known varices - continue PPI - hemoglobin drifted down below 7.0 overnight 10/28>10/29 but despite this there are still no signs of large-scale bleeding presently -  has been transfused a total of 3 units PRBCs since admission - continue to monitor hemoglobin in serial fashion - GI following   Chronic thrombocytopenia Due to above - platelet count holding steady   Refractory hypokalemia Continue supplementation and follow - magnesium is normal  Hyponatremia Due to severe liver disease -stable with diuresis  Multifactorial pulmonary edema Clinically stabilizing with ongoing diuresis -successfully weaned to room air  Disposition: Eventual discharge home when hemoglobin stable and sufficiently diuresed  Objective: Blood pressure (!) 106/54, pulse 76, temperature 98.3 F (36.8 C), temperature source Oral, resp. rate 19, height 5\' 8"  (1.727 m), weight 81.7 kg, SpO2 100 %.  Intake/Output Summary (Last 24 hours) at 05/20/2022 0902 Last data filed at 05/20/2022 0448 Gross per 24 hour  Intake 733.33 ml  Output 1950 ml  Net -1216.67 ml    Filed Weights   05/16/22 1450 05/17/22 0105  Weight: 83.8 kg 81.7 kg   Examination: General: No acute respiratory distress Lungs: Clear to auscultation bilaterally - no wheeze  Cardiovascular: RRR without murmur or rub Abdomen: Distended with obvious ascites without rebound, bowel sounds positive, no appreciable mass - no significant change  Extremities: 2+ pitting edema bilateral lower extremities without significant change  CBC: Recent Labs  Lab 05/17/22 0136 05/17/22 0921 05/17/22 1630 05/18/22 0258 05/19/22 0516 05/19/22 1438 05/20/22 0451  WBC 14.8* 12.2*  --  13.3* 14.2*  --  14.1*  NEUTROABS 11.5* 9.7*  --   --   --   --   --   HGB 6.1* 6.6*   < > 7.4* 6.8* 9.5* 8.2*  HCT 18.2* 19.5*   < > 21.9* 20.1* 28.4* 24.1*  MCV 120.5* 114.0*  --  111.2* 111.7*  --  107.1*  PLT 51* 50*  --  49* 52*  --  56*   < > = values in this interval not displayed.    Basic Metabolic Panel: Recent Labs  Lab 05/16/22 2129 05/17/22 0136 05/17/22 0921 05/18/22 0258 05/19/22 0516 05/20/22 0451  NA  --  130*    < > 133* 133* 133*  K  --  2.5*   < > 3.6 3.7 4.0  CL  --  99   < > 103 104 103  CO2  --  23   < > 21* 23 23  GLUCOSE  --  96   < > 139* 107* 108*  BUN  --  12   < > 13 17 21*  CREATININE  --  0.97   < > 1.19 1.21 1.12  CALCIUM  --  8.1*   < > 8.5* 8.8* 8.8*  MG 1.9 2.3  --  1.9  --   --   PHOS  --  3.5  --  3.2  --   --    < > = values in this interval not displayed.    GFR: Estimated Creatinine Clearance: 81.4 mL/min (by C-G formula based on SCr of 1.12 mg/dL).   Scheduled Meds:  Chlorhexidine Gluconate Cloth  6 each Topical Daily   folic acid  1 mg Oral Daily   furosemide  40 mg Intravenous Q12H   lactulose  10 g Oral TID   mouth rinse  15 mL Mouth Rinse 4 times per day   pantoprazole  40 mg Oral BID   phytonadione  10 mg Oral Daily   potassium chloride  40 mEq Oral TID   prednisoLONE  40 mg Oral Daily   spironolactone  25 mg Oral Daily   thiamine  100 mg Oral Daily   Continuous Infusions:  cefTRIAXone (ROCEPHIN)  IV 2 g (05/20/22 0312)     LOS: 3 days   Cherene Altes, MD [facility] Office  [phone] Pager - Text Page per Shea Evans  If 7PM-7AM, please contact night-coverage per Amion 05/20/2022, 9:02 AM

## 2022-05-21 DIAGNOSIS — N179 Acute kidney failure, unspecified: Secondary | ICD-10-CM

## 2022-05-21 DIAGNOSIS — K7290 Hepatic failure, unspecified without coma: Secondary | ICD-10-CM

## 2022-05-21 DIAGNOSIS — K922 Gastrointestinal hemorrhage, unspecified: Secondary | ICD-10-CM

## 2022-05-21 LAB — COMPREHENSIVE METABOLIC PANEL WITH GFR
ALT: UNDETERMINED U/L (ref 0–44)
ALT: 106 U/L — ABNORMAL HIGH (ref 0–44)
AST: UNDETERMINED U/L (ref 15–41)
AST: 138 U/L — ABNORMAL HIGH (ref 15–41)
Albumin: UNDETERMINED g/dL (ref 3.5–5.0)
Albumin: 2.3 g/dL — ABNORMAL LOW (ref 3.5–5.0)
Alkaline Phosphatase: UNDETERMINED U/L (ref 38–126)
Alkaline Phosphatase: 113 U/L (ref 38–126)
Anion gap: UNDETERMINED (ref 5–15)
Anion gap: 8 (ref 5–15)
BUN: UNDETERMINED mg/dL (ref 6–20)
BUN: 22 mg/dL — ABNORMAL HIGH (ref 6–20)
CO2: UNDETERMINED mmol/L (ref 22–32)
CO2: 22 mmol/L (ref 22–32)
Calcium: UNDETERMINED mg/dL (ref 8.9–10.3)
Calcium: 9.2 mg/dL (ref 8.9–10.3)
Chloride: UNDETERMINED mmol/L (ref 98–111)
Chloride: 103 mmol/L (ref 98–111)
Creatinine, Ser: UNDETERMINED mg/dL (ref 0.61–1.24)
Creatinine, Ser: 1.02 mg/dL (ref 0.61–1.24)
GFR calc Af Amer: UNDETERMINED mL/min
GFR, Estimated: UNDETERMINED mL/min
GFR, Estimated: >60 mL/min
Glucose, Bld: UNDETERMINED mg/dL (ref 70–99)
Glucose, Bld: 82 mg/dL (ref 70–99)
Potassium: UNDETERMINED mmol/L (ref 3.5–5.1)
Potassium: 3.1 mmol/L — ABNORMAL LOW (ref 3.5–5.1)
Sodium: UNDETERMINED mmol/L (ref 135–145)
Sodium: 133 mmol/L — ABNORMAL LOW (ref 135–145)
Total Bilirubin: UNDETERMINED mg/dL (ref 0.3–1.2)
Total Bilirubin: 26.5 mg/dL (ref 0.3–1.2)
Total Protein: UNDETERMINED g/dL (ref 6.5–8.1)
Total Protein: 8.4 g/dL — ABNORMAL HIGH (ref 6.5–8.1)

## 2022-05-21 LAB — CBC
HCT: 24.2 % — ABNORMAL LOW (ref 39.0–52.0)
Hemoglobin: 8.1 g/dL — ABNORMAL LOW (ref 13.0–17.0)
MCH: 36.3 pg — ABNORMAL HIGH (ref 26.0–34.0)
MCHC: 33.5 g/dL (ref 30.0–36.0)
MCV: 108.5 fL — ABNORMAL HIGH (ref 80.0–100.0)
Platelets: 48 10*3/uL — ABNORMAL LOW (ref 150–400)
RBC: 2.23 MIL/uL — ABNORMAL LOW (ref 4.22–5.81)
RDW: 28.2 % — ABNORMAL HIGH (ref 11.5–15.5)
WBC: 12.6 10*3/uL — ABNORMAL HIGH (ref 4.0–10.5)
nRBC: 0 % (ref 0.0–0.2)

## 2022-05-21 LAB — CYTOLOGY - NON PAP

## 2022-05-21 MED ORDER — FUROSEMIDE 40 MG PO TABS: 40.0000 mg | ORAL_TABLET | Freq: Two times a day (BID) | ORAL | 2 refills | Status: AC

## 2022-05-21 MED ORDER — POTASSIUM CHLORIDE CRYS ER 20 MEQ PO TBCR
40.0000 meq | EXTENDED_RELEASE_TABLET | Freq: Once | ORAL | Status: AC
  Administered 2022-05-21: 40 meq via ORAL
  Filled 2022-05-21: qty 2

## 2022-05-21 MED ORDER — SPIRONOLACTONE 25 MG PO TABS: 25.0000 mg | ORAL_TABLET | Freq: Every day | ORAL | 2 refills | Status: AC

## 2022-05-21 MED ORDER — LACTULOSE 10 GM/15ML PO SOLN: 10.0000 g | Freq: Two times a day (BID) | ORAL | 2 refills | Status: AC

## 2022-05-21 MED ORDER — POTASSIUM CHLORIDE CRYS ER 20 MEQ PO TBCR: 20.0000 meq | EXTENDED_RELEASE_TABLET | Freq: Two times a day (BID) | ORAL | 2 refills | Status: AC

## 2022-05-21 NOTE — Progress Notes (Signed)
Mobility Specialist - Progress Note   05/21/22 1521  Mobility  Activity Ambulated independently in hallway  Level of Assistance Independent  Assistive Device None  Distance Ambulated (ft) 500 ft  Activity Response Tolerated well  Mobility Referral Yes  $Mobility charge 1 Mobility   Pt received in room and agreeable to mobility. No complaints during mobility. Pt to room after session with all needs met.    [facility]

## 2022-05-21 NOTE — Progress Notes (Signed)
Patient continues to have no rectal bleeding. VSS on RA. AM labs collected and are in process. Patient up to bath ad lib.

## 2022-05-21 NOTE — Progress Notes (Signed)
  Transition of Care (TOC) Screening Note   Patient Details  Name: Seth Holmes Date of Birth: [birth year]   Transition of Care [facility]) CM/SW Contact:    Dessa Phi, RN Phone Number: 05/21/2022, 2:48 PM    Transition of Care Department [facility]) has reviewed patient and no TOC needs have been identified at this time. We will continue to monitor patient advancement through interdisciplinary progression rounds. If new patient transition needs arise, please place a TOC consult.

## 2022-05-21 NOTE — Progress Notes (Signed)
Mobility Specialist - Progress Note   05/21/22 1025  Mobility  Activity Ambulated independently in hallway  Level of Assistance Independent  Assistive Device None  Distance Ambulated (ft) 500 ft  Range of Motion/Exercises Active  Activity Response Tolerated well  Mobility Referral Yes  $Mobility charge 1 Mobility   Pt received in bed and agreeable to mobility. No complaints during mobility. Pt to bed after session with all needs met.     [facility]

## 2022-05-21 NOTE — Progress Notes (Addendum)
[facility] Gastroenterology Progress Note  Seth Holmes 46 y.o. August 29, 1975   Subjective: Patient seen and examined sitting in bed.  He is able to tolerate his diet well.  He wishes to go home.  Denies abdominal pain, denies rectal bleeding.  2 bowel movements yesterday.   Objective: Vital signs in last 24 hours: Vitals:   05/21/22 0550 05/21/22 1309  BP: (!) 103/49 (!) 105/48  Pulse: 67 70  Resp: 16 18  Temp: 98.2 F (36.8 C) 98.1 F (36.7 C)  SpO2: 100% 100%    Physical Exam:  General:  Alert, cooperative, no distress, appears stated age, jaundiced  Head:  Normocephalic, without obvious abnormality, atraumatic  Eyes:  icteric sclera, EOM's intact  Lungs:   Clear to auscultation bilaterally, respirations unlabored  Heart:  Regular rate and rhythm, S1, S2 normal  Abdomen:   Soft, non-tender, bowel sounds active all four quadrants,  no masses,   Extremities: Extremities normal, atraumatic, bilateral lower extremity pitting edema    Lab Results: Recent Labs    05/21/22 0728 05/21/22 1246  NA QUANTITY NOT SUFFICIENT, UNABLE TO PERFORM TEST 133*  K QUANTITY NOT SUFFICIENT, UNABLE TO PERFORM TEST 3.1*  CL QUANTITY NOT SUFFICIENT, UNABLE TO PERFORM TEST 103  CO2 QUANTITY NOT SUFFICIENT, UNABLE TO PERFORM TEST 22  GLUCOSE QUANTITY NOT SUFFICIENT, UNABLE TO PERFORM TEST 82  BUN QUANTITY NOT SUFFICIENT, UNABLE TO PERFORM TEST 22*  CREATININE QUANTITY NOT SUFFICIENT, UNABLE TO PERFORM TEST 1.02  CALCIUM QUANTITY NOT SUFFICIENT, UNABLE TO PERFORM TEST 9.2   Recent Labs    05/21/22 0728 05/21/22 1246  AST QUANTITY NOT SUFFICIENT, UNABLE TO PERFORM TEST 138*  ALT QUANTITY NOT SUFFICIENT, UNABLE TO PERFORM TEST 106*  ALKPHOS QUANTITY NOT SUFFICIENT, UNABLE TO PERFORM TEST 113  BILITOT QUANTITY NOT SUFFICIENT, UNABLE TO PERFORM TEST 26.5*  PROT QUANTITY NOT SUFFICIENT, UNABLE TO PERFORM TEST 8.4*  ALBUMIN QUANTITY NOT SUFFICIENT, UNABLE TO PERFORM TEST 2.3*   Recent Labs     05/20/22 0451 05/21/22 0728  WBC 14.1* 12.6*  HGB 8.2* 8.1*  HCT 24.1* 24.2*  MCV 107.1* 108.5*  PLT 56* 48*   Recent Labs    05/19/22 0516 05/20/22 0451  LABPROT 36.7* 36.8*  INR 3.7* 3.8*      Assessment Alcoholic hepatitis Cirrhosis secondary to alcohol use  HGB 8.1(8.2) Platelets 48(56) AST 138(140) ALT 106(90)  Alkphos 113(104) TBili 26.5(24.4) GFR >60  INR 05/20/2022 3.8  MELD 3.0: 36 at 05/21/2022 12:46 PM MELD-Na: 35 at 05/21/2022 12:46 PM Calculated from: Serum Creatinine: 1.02 mg/dL at 05/21/2022 12:46 PM Serum Sodium: 133 mmol/L at 05/21/2022 12:46 PM Total Bilirubin: 26.5 mg/dL at 05/21/2022 12:46 PM Serum Albumin: 2.3 g/dL at 05/21/2022 12:46 PM INR(ratio): 3.8 at 05/20/2022  4:51 AM Age at listing (hypothetical): 46 years Sex: Male at 05/21/2022 12:46 PM  Maddrey's discriminant function on admission 130.2 Lillie score day 5: 0.477, patient with poor prognosis and not responding to steroids, can discontinue prednisolone 40 mg.    Plan: Continue lactulose 10 g 3 times daily Continue furosemide 40 mg twice daily Continue spironolactone 25 mg daily Discontinue prednisolone 40 mg daily given unsatisfactory Lillie score Recommend low-sodium diet Patient will need follow-up labs as outpatient to trend LFTs and T. Bili Patient will need to follow-up with [facility] or [facility] for evaluation of esophageal varices with EGD and colonoscopy colon cancer surveillance. Recommend complete alcohol cessation [facility] will sign off  Charlott Rakes PA-C 05/21/2022, 3:49 PM  Contact #  [phone]  

## 2022-05-21 NOTE — Discharge Instructions (Signed)
DO NOT DRINK ALCOHOL EVER AGAIN

## 2022-05-21 NOTE — Discharge Summary (Addendum)
DISCHARGE SUMMARY  Seth Holmes  MR#: [id number]  DOB:28-Oct-1975  Date of Admission: 05/16/2022 Date of Discharge: 05/21/2022  Attending Physician:Kate Sweetman Hennie Duos, MD  Patient's GEX:[id number], Seth Holmes Medical  Consults: [facility]   Disposition: D/C home  Follow-up Appts:  [facility] Follow up.   Contact information: [address] [place] [address] [phone]         [facility] Follow up.   Why: Calll your doctors at the [facility] and schedule a follow up in 7-10 days.                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  Work-up in the ER revealed severe hypoalbuminemia, decompensated cirrhosis of the liver with ascites, and associated coagulopathy with anemia and severe jaundice.  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 No significant spontaneous bleeding evident during this admit - vitamin K has had no effect, as expected   Anemia of chronic disease with anemia of acute blood loss Guaiac positive on ER evaluation - suspected esophagitis/gastritis with known varices - continue PPI - hemoglobin drifted down below 7.0 overnight 10/28>10/29 but despite this there were still no signs of large-scale bleeding appreciated - has been transfused a total of 3 units PRBCs since admission - continue to monitor hemoglobin in serial fashion - GI followed - hemodynamically stable at time of d/c - outpatient EGD should be considered    Chronic thrombocytopenia Due to above - platelet count holding steady    Refractory hypokalemia Supplemented during this admit - magnesium is normal at time of d/c - continue supplement at home    Hyponatremia Due to severe liver disease - stable with diuresis   Multifactorial pulmonary edema Clinically stabilizing with ongoing diuresis -successfully weaned to room air    Allergies as of 05/21/2022   No Known Allergies      Medication List     STOP taking these medications    OVER THE COUNTER MEDICATION   sucralfate 1 GM/10ML suspension Commonly known as: CARAFATE       TAKE these medications    folic acid 1 MG tablet Commonly  known as: FOLVITE Take 1 tablet (1 mg total) by mouth daily.   furosemide 40 MG tablet Commonly known as: LASIX Take 1 tablet (40 mg total) by mouth 2 (two) times daily. What changed:  medication strength how much to take   lactulose 10 GM/15ML  solution Commonly known as: CHRONULAC Take 15 mLs (10 g total) by mouth 2 (two) times daily.   pantoprazole 40 MG tablet Commonly known as: PROTONIX Take 1 tablet (40 mg total) by mouth daily.   potassium chloride SA 20 MEQ tablet Commonly known as: KLOR-CON M Take 1 tablet (20 mEq total) by mouth 2 (two) times daily.   spironolactone 25 MG tablet Commonly known as: ALDACTONE Take 1 tablet (25 mg total) by mouth daily. Start taking on: May 22, 2022   thiamine 100 MG tablet Commonly known as: VITAMIN B1 Take 1 tablet (100 mg total) by mouth daily.        Day of Discharge BP (!) 105/48 (BP Location: Right Arm)   Pulse 70   Temp 98.1 F (36.7 C) (Oral)   Resp 18   Ht 5\' 8"  (1.727 m)   Wt 78.1 kg   SpO2 100%   BMI 26.18 kg/m   Physical Exam: General: No acute respiratory distress Lungs: Clear to auscultation bilaterally without wheezes or crackles Cardiovascular: Regular rate and rhythm without murmur gallop or rub normal S1 and S2 Abdomen: Nontender, nondistended, soft, bowel sounds positive, no rebound, no ascites, no appreciable mass Extremities: No significant cyanosis, clubbing, or edema bilateral lower extremities  CBC: Recent Labs  Lab 05/17/22 0136 05/17/22 0921 05/17/22 1630 05/18/22 0258 05/19/22 0516 05/19/22 1438 05/20/22 0451 05/21/22 0728  WBC 14.8* 12.2*  --  13.3* 14.2*  --  14.1* 12.6*  NEUTROABS 11.5* 9.7*  --   --   --   --   --   --   HGB 6.1* 6.6*   < > 7.4* 6.8* 9.5* 8.2* 8.1*  HCT 18.2* 19.5*   < > 21.9* 20.1* 28.4* 24.1* 24.2*  MCV 120.5* 114.0*  --  111.2* 111.7*  --  107.1* 108.5*  PLT 51* 50*  --  49* 52*  --  56* 48*   < > = values in this interval not displayed.     Time spent in discharge (includes decision making & examination of pt): 35 minutes  05/21/2022, 2:49 PM   Cherene Altes, MD [facility] Office  [phone]

## 2022-05-21 NOTE — Progress Notes (Signed)
Patient states he will wash up later for a bath declines CHG due to it making him itch

## 2022-06-03 LAB — MISC LABCORP TEST (SEND OUT): Labcorp test code: 9985

## 2022-06-10 ENCOUNTER — Ambulatory Visit: Payer: 59 | Admitting: Family Medicine

## 2022-07-29 ENCOUNTER — Ambulatory Visit: Payer: 59 | Admitting: Family Medicine
